# Patient Record
Sex: Female | Born: 1935
Health system: Southern US, Community
[De-identification: ages and names within clinical notes are randomized; demographics above are authoritative.]

## PROBLEM LIST (undated history)

## (undated) DIAGNOSIS — R413 Other amnesia: Secondary | ICD-10-CM

## (undated) DIAGNOSIS — D126 Benign neoplasm of colon, unspecified: Secondary | ICD-10-CM

## (undated) DIAGNOSIS — N3289 Other specified disorders of bladder: Secondary | ICD-10-CM

## (undated) DIAGNOSIS — I10 Essential (primary) hypertension: Secondary | ICD-10-CM

## (undated) DIAGNOSIS — H9190 Unspecified hearing loss, unspecified ear: Secondary | ICD-10-CM

## (undated) DIAGNOSIS — S329XXA Fracture of unspecified parts of lumbosacral spine and pelvis, initial encounter for closed fracture: Secondary | ICD-10-CM

## (undated) DIAGNOSIS — J342 Deviated nasal septum: Secondary | ICD-10-CM

## (undated) DIAGNOSIS — M1990 Unspecified osteoarthritis, unspecified site: Secondary | ICD-10-CM

## (undated) DIAGNOSIS — F419 Anxiety disorder, unspecified: Secondary | ICD-10-CM

## (undated) DIAGNOSIS — D696 Thrombocytopenia, unspecified: Secondary | ICD-10-CM

## (undated) DIAGNOSIS — D72819 Decreased white blood cell count, unspecified: Secondary | ICD-10-CM

## (undated) DIAGNOSIS — E039 Hypothyroidism, unspecified: Secondary | ICD-10-CM

## (undated) DIAGNOSIS — K7689 Other specified diseases of liver: Secondary | ICD-10-CM

## (undated) DIAGNOSIS — K760 Fatty (change of) liver, not elsewhere classified: Secondary | ICD-10-CM

## (undated) DIAGNOSIS — E785 Hyperlipidemia, unspecified: Secondary | ICD-10-CM

## (undated) HISTORY — DX: Decreased white blood cell count, unspecified: D72.819

## (undated) HISTORY — DX: Essential (primary) hypertension: I10

## (undated) HISTORY — DX: Fracture of unspecified parts of lumbosacral spine and pelvis, initial encounter for closed fracture: S32.9XXA

## (undated) HISTORY — DX: Thrombocytopenia, unspecified: D69.6

## (undated) HISTORY — DX: Anxiety disorder, unspecified: F41.9

## (undated) HISTORY — PX: NASAL SEPTUM SURGERY: SHX37

## (undated) HISTORY — DX: Other amnesia: R41.3

## (undated) HISTORY — DX: Benign neoplasm of colon, unspecified: D12.6

## (undated) HISTORY — DX: Hypothyroidism, unspecified: E03.9

## (undated) HISTORY — DX: Deviated nasal septum: J34.2

## (undated) HISTORY — PX: BLADDER REPAIR: SHX76

## (undated) HISTORY — DX: Unspecified hearing loss, unspecified ear: H91.90

## (undated) HISTORY — DX: Other specified disorders of bladder: N32.89

## (undated) HISTORY — DX: Other specified diseases of liver: K76.89

## (undated) HISTORY — DX: Unspecified osteoarthritis, unspecified site: M19.90

## (undated) HISTORY — PX: VAGINAL HYSTERECTOMY: SUR661

## (undated) HISTORY — DX: Hyperlipidemia, unspecified: E78.5

## (undated) HISTORY — DX: Fatty (change of) liver, not elsewhere classified: K76.0

---

## 1997-12-12 ENCOUNTER — Ambulatory Visit (HOSPITAL_COMMUNITY): Admission: RE | Admit: 1997-12-12 | Discharge: 1997-12-12 | Payer: Self-pay | Admitting: *Deleted

## 1998-06-15 ENCOUNTER — Ambulatory Visit (HOSPITAL_COMMUNITY): Admission: RE | Admit: 1998-06-15 | Discharge: 1998-06-15 | Payer: Self-pay | Admitting: *Deleted

## 1999-05-28 ENCOUNTER — Other Ambulatory Visit: Admission: RE | Admit: 1999-05-28 | Discharge: 1999-05-28 | Payer: Self-pay | Admitting: *Deleted

## 1999-06-18 ENCOUNTER — Ambulatory Visit (HOSPITAL_COMMUNITY): Admission: RE | Admit: 1999-06-18 | Discharge: 1999-06-18 | Payer: Self-pay | Admitting: *Deleted

## 2000-04-17 ENCOUNTER — Emergency Department (HOSPITAL_COMMUNITY): Admission: EM | Admit: 2000-04-17 | Discharge: 2000-04-18 | Payer: Self-pay | Admitting: Emergency Medicine

## 2000-04-18 ENCOUNTER — Observation Stay (HOSPITAL_COMMUNITY): Admission: EM | Admit: 2000-04-18 | Discharge: 2000-04-19 | Payer: Self-pay | Admitting: Family Medicine

## 2000-04-18 ENCOUNTER — Encounter: Payer: Self-pay | Admitting: Family Medicine

## 2000-06-25 ENCOUNTER — Ambulatory Visit (HOSPITAL_COMMUNITY): Admission: RE | Admit: 2000-06-25 | Discharge: 2000-06-25 | Payer: Self-pay | Admitting: Family Medicine

## 2000-06-25 ENCOUNTER — Encounter: Payer: Self-pay | Admitting: Family Medicine

## 2001-06-30 ENCOUNTER — Ambulatory Visit (HOSPITAL_COMMUNITY): Admission: RE | Admit: 2001-06-30 | Discharge: 2001-06-30 | Payer: Self-pay | Admitting: Family Medicine

## 2001-06-30 ENCOUNTER — Encounter: Payer: Self-pay | Admitting: Family Medicine

## 2001-08-11 DIAGNOSIS — D696 Thrombocytopenia, unspecified: Secondary | ICD-10-CM

## 2001-08-11 DIAGNOSIS — D72819 Decreased white blood cell count, unspecified: Secondary | ICD-10-CM

## 2001-08-11 HISTORY — DX: Decreased white blood cell count, unspecified: D72.819

## 2001-08-11 HISTORY — DX: Thrombocytopenia, unspecified: D69.6

## 2002-05-05 ENCOUNTER — Encounter: Admission: RE | Admit: 2002-05-05 | Discharge: 2002-05-05 | Payer: Self-pay | Admitting: Internal Medicine

## 2002-05-05 ENCOUNTER — Encounter: Payer: Self-pay | Admitting: Internal Medicine

## 2002-05-11 DIAGNOSIS — D126 Benign neoplasm of colon, unspecified: Secondary | ICD-10-CM

## 2002-05-11 HISTORY — DX: Benign neoplasm of colon, unspecified: D12.6

## 2002-05-18 ENCOUNTER — Encounter: Payer: Self-pay | Admitting: Gastroenterology

## 2002-06-08 ENCOUNTER — Encounter: Payer: Self-pay | Admitting: Gastroenterology

## 2002-07-01 ENCOUNTER — Ambulatory Visit (HOSPITAL_COMMUNITY): Admission: RE | Admit: 2002-07-01 | Discharge: 2002-07-01 | Payer: Self-pay | Admitting: Family Medicine

## 2002-07-01 ENCOUNTER — Encounter: Payer: Self-pay | Admitting: Family Medicine

## 2002-08-08 ENCOUNTER — Encounter: Payer: Self-pay | Admitting: Gastroenterology

## 2002-09-16 ENCOUNTER — Ambulatory Visit (HOSPITAL_COMMUNITY): Admission: RE | Admit: 2002-09-16 | Discharge: 2002-09-16 | Payer: Self-pay | Admitting: Oncology

## 2002-09-16 ENCOUNTER — Encounter (HOSPITAL_COMMUNITY): Payer: Self-pay | Admitting: Oncology

## 2003-07-04 ENCOUNTER — Other Ambulatory Visit: Admission: RE | Admit: 2003-07-04 | Discharge: 2003-07-04 | Payer: Self-pay | Admitting: Family Medicine

## 2004-08-16 ENCOUNTER — Ambulatory Visit: Payer: Self-pay | Admitting: Family Medicine

## 2004-09-06 ENCOUNTER — Ambulatory Visit: Payer: Self-pay | Admitting: Family Medicine

## 2004-10-17 ENCOUNTER — Ambulatory Visit: Payer: Self-pay | Admitting: Oncology

## 2004-12-03 ENCOUNTER — Ambulatory Visit: Payer: Self-pay | Admitting: Family Medicine

## 2005-04-10 ENCOUNTER — Ambulatory Visit: Payer: Self-pay | Admitting: Internal Medicine

## 2005-04-17 ENCOUNTER — Ambulatory Visit: Payer: Self-pay | Admitting: Oncology

## 2005-04-21 ENCOUNTER — Ambulatory Visit: Payer: Self-pay | Admitting: Internal Medicine

## 2005-07-02 ENCOUNTER — Ambulatory Visit: Payer: Self-pay | Admitting: Internal Medicine

## 2005-07-10 ENCOUNTER — Ambulatory Visit: Payer: Self-pay | Admitting: Internal Medicine

## 2005-08-01 ENCOUNTER — Ambulatory Visit: Payer: Self-pay | Admitting: Internal Medicine

## 2005-08-14 ENCOUNTER — Ambulatory Visit: Payer: Self-pay

## 2005-08-14 ENCOUNTER — Encounter: Payer: Self-pay | Admitting: Cardiology

## 2005-08-21 ENCOUNTER — Ambulatory Visit: Payer: Self-pay | Admitting: Internal Medicine

## 2005-08-22 ENCOUNTER — Ambulatory Visit: Payer: Self-pay | Admitting: Internal Medicine

## 2005-09-08 ENCOUNTER — Ambulatory Visit: Payer: Self-pay | Admitting: Internal Medicine

## 2005-09-11 ENCOUNTER — Ambulatory Visit: Payer: Self-pay | Admitting: Gastroenterology

## 2005-09-25 ENCOUNTER — Ambulatory Visit: Payer: Self-pay | Admitting: Gastroenterology

## 2005-09-30 ENCOUNTER — Ambulatory Visit: Payer: Self-pay | Admitting: Cardiology

## 2005-10-16 ENCOUNTER — Ambulatory Visit: Payer: Self-pay | Admitting: Oncology

## 2005-10-17 ENCOUNTER — Ambulatory Visit: Payer: Self-pay | Admitting: Internal Medicine

## 2005-10-29 ENCOUNTER — Ambulatory Visit: Payer: Self-pay

## 2005-11-24 ENCOUNTER — Ambulatory Visit: Payer: Self-pay | Admitting: Internal Medicine

## 2006-01-07 ENCOUNTER — Ambulatory Visit: Payer: Self-pay | Admitting: Internal Medicine

## 2006-03-18 ENCOUNTER — Ambulatory Visit: Payer: Self-pay | Admitting: Internal Medicine

## 2006-04-16 ENCOUNTER — Ambulatory Visit: Payer: Self-pay | Admitting: Oncology

## 2006-04-17 LAB — CBC WITH DIFFERENTIAL/PLATELET
BASO%: 0.2 % (ref 0.0–2.0)
Basophils Absolute: 0 10*3/uL (ref 0.0–0.1)
EOS%: 0.4 % (ref 0.0–7.0)
HCT: 37.3 % (ref 34.8–46.6)
HGB: 12.9 g/dL (ref 11.6–15.9)
MCHC: 34.7 g/dL (ref 32.0–36.0)
MONO#: 0.3 10*3/uL (ref 0.1–0.9)
NEUT%: 33.2 % — ABNORMAL LOW (ref 39.6–76.8)
RDW: 13.4 % (ref 11.3–14.5)
WBC: 2.8 10*3/uL — ABNORMAL LOW (ref 3.9–10.0)
lymph#: 1.6 10*3/uL (ref 0.9–3.3)

## 2006-04-17 LAB — COMPREHENSIVE METABOLIC PANEL
ALT: 21 U/L (ref 0–40)
AST: 26 U/L (ref 0–37)
Albumin: 4.2 g/dL (ref 3.5–5.2)
CO2: 27 mEq/L (ref 19–32)
Calcium: 9 mg/dL (ref 8.4–10.5)
Chloride: 105 mEq/L (ref 96–112)
Creatinine, Ser: 0.84 mg/dL (ref 0.40–1.20)
Potassium: 4.2 mEq/L (ref 3.5–5.3)

## 2006-04-17 LAB — LACTATE DEHYDROGENASE: LDH: 205 U/L (ref 94–250)

## 2006-06-29 ENCOUNTER — Ambulatory Visit: Payer: Self-pay | Admitting: Internal Medicine

## 2006-06-29 LAB — CONVERTED CEMR LAB
Free T4: 0.6 ng/dL — ABNORMAL LOW (ref 0.9–1.8)
TSH: 4.15 microintl units/mL (ref 0.35–5.50)

## 2006-07-13 ENCOUNTER — Ambulatory Visit: Payer: Self-pay | Admitting: Internal Medicine

## 2006-10-07 ENCOUNTER — Ambulatory Visit: Payer: Self-pay | Admitting: Internal Medicine

## 2006-10-07 LAB — CONVERTED CEMR LAB
ALT: 30 units/L (ref 0–40)
AST: 31 units/L (ref 0–37)
BUN: 10 mg/dL (ref 6–23)
Bacteria, UA: NEGATIVE
Creatinine, Ser: 0.6 mg/dL (ref 0.4–1.2)
Crystals: NEGATIVE
HDL: 58.5 mg/dL (ref >39.0)
Mucus, UA: NEGATIVE
RBC / HPF: NONE SEEN
Specific Gravity, Urine: 1.015 (ref 1.000–1.03)

## 2006-10-13 ENCOUNTER — Ambulatory Visit: Payer: Self-pay | Admitting: Internal Medicine

## 2007-06-07 ENCOUNTER — Encounter: Payer: Self-pay | Admitting: Internal Medicine

## 2007-06-07 DIAGNOSIS — I10 Essential (primary) hypertension: Secondary | ICD-10-CM | POA: Insufficient documentation

## 2007-07-01 ENCOUNTER — Ambulatory Visit: Payer: Self-pay | Admitting: Internal Medicine

## 2007-07-01 DIAGNOSIS — R55 Syncope and collapse: Secondary | ICD-10-CM

## 2007-07-01 LAB — CONVERTED CEMR LAB
Basophils Absolute: 0 10*3/uL (ref 0.0–0.1)
Eosinophils Absolute: 0 10*3/uL (ref 0.0–0.6)
HCT: 41.3 % (ref 36.0–46.0)
Hemoglobin: 14.1 g/dL (ref 12.0–15.0)
Lymphocytes Relative: 49.7 % — ABNORMAL HIGH (ref 12.0–46.0)
MCHC: 34.1 g/dL (ref 30.0–36.0)
MCV: 87 fL (ref 78.0–100.0)
Monocytes Absolute: 0.4 10*3/uL (ref 0.2–0.7)
Neutrophils Relative %: 36.9 % — ABNORMAL LOW (ref 43.0–77.0)
RDW: 12.7 % (ref 11.5–14.6)

## 2007-07-07 ENCOUNTER — Ambulatory Visit: Payer: Self-pay | Admitting: Internal Medicine

## 2007-07-07 DIAGNOSIS — R799 Abnormal finding of blood chemistry, unspecified: Secondary | ICD-10-CM

## 2007-07-07 DIAGNOSIS — E039 Hypothyroidism, unspecified: Secondary | ICD-10-CM | POA: Insufficient documentation

## 2007-07-07 DIAGNOSIS — F411 Generalized anxiety disorder: Secondary | ICD-10-CM

## 2007-09-29 ENCOUNTER — Encounter: Payer: Self-pay | Admitting: Internal Medicine

## 2007-11-10 ENCOUNTER — Telehealth: Payer: Self-pay | Admitting: Internal Medicine

## 2007-11-30 ENCOUNTER — Telehealth: Payer: Self-pay | Admitting: Internal Medicine

## 2007-12-23 ENCOUNTER — Ambulatory Visit: Payer: Self-pay | Admitting: Internal Medicine

## 2007-12-24 LAB — CONVERTED CEMR LAB
AST: 29 units/L (ref 0–37)
Albumin: 3.9 g/dL (ref 3.5–5.2)
Alkaline Phosphatase: 90 units/L (ref 39–117)
BUN: 12 mg/dL (ref 6–23)
CO2: 31 meq/L (ref 19–32)
Chloride: 104 meq/L (ref 96–112)
Eosinophils Relative: 3.6 % (ref 0.0–5.0)
Glucose, Bld: 97 mg/dL (ref 70–99)
HCT: 41.6 % (ref 36.0–46.0)
HDL: 45.1 mg/dL (ref >39.0)
Hemoglobin, Urine: NEGATIVE
Lymphocytes Relative: 54.7 % — ABNORMAL HIGH (ref 12.0–46.0)
Monocytes Relative: 12.4 % — ABNORMAL HIGH (ref 3.0–12.0)
Neutrophils Relative %: 28.3 % — ABNORMAL LOW (ref 43.0–77.0)
Nitrite: NEGATIVE
Platelets: 141 10*3/uL — ABNORMAL LOW (ref 150–400)
Potassium: 4.4 meq/L (ref 3.5–5.1)
Total Protein, Urine: NEGATIVE mg/dL
Total Protein: 7.4 g/dL (ref 6.0–8.3)
WBC: 3.3 10*3/uL — ABNORMAL LOW (ref 4.5–10.5)

## 2007-12-29 ENCOUNTER — Ambulatory Visit: Payer: Self-pay | Admitting: Internal Medicine

## 2007-12-29 DIAGNOSIS — R350 Frequency of micturition: Secondary | ICD-10-CM

## 2007-12-29 DIAGNOSIS — I739 Peripheral vascular disease, unspecified: Secondary | ICD-10-CM | POA: Insufficient documentation

## 2007-12-29 DIAGNOSIS — E785 Hyperlipidemia, unspecified: Secondary | ICD-10-CM

## 2007-12-31 ENCOUNTER — Ambulatory Visit: Payer: Self-pay

## 2008-04-05 ENCOUNTER — Ambulatory Visit: Payer: Self-pay | Admitting: Internal Medicine

## 2008-04-05 DIAGNOSIS — R61 Generalized hyperhidrosis: Secondary | ICD-10-CM | POA: Insufficient documentation

## 2008-04-11 ENCOUNTER — Ambulatory Visit: Payer: Self-pay | Admitting: Internal Medicine

## 2008-04-11 DIAGNOSIS — R079 Chest pain, unspecified: Secondary | ICD-10-CM

## 2008-04-13 ENCOUNTER — Ambulatory Visit: Payer: Self-pay | Admitting: Internal Medicine

## 2008-04-24 ENCOUNTER — Telehealth: Payer: Self-pay | Admitting: Internal Medicine

## 2008-08-01 ENCOUNTER — Ambulatory Visit: Payer: Self-pay | Admitting: Internal Medicine

## 2008-08-01 DIAGNOSIS — R519 Headache, unspecified: Secondary | ICD-10-CM | POA: Insufficient documentation

## 2008-08-01 DIAGNOSIS — R51 Headache: Secondary | ICD-10-CM | POA: Insufficient documentation

## 2008-09-29 ENCOUNTER — Encounter: Payer: Self-pay | Admitting: Internal Medicine

## 2008-10-31 ENCOUNTER — Ambulatory Visit: Payer: Self-pay | Admitting: Internal Medicine

## 2008-12-25 ENCOUNTER — Ambulatory Visit: Payer: Self-pay | Admitting: Internal Medicine

## 2008-12-25 LAB — CONVERTED CEMR LAB
ALT: 50 units/L — ABNORMAL HIGH (ref 0–35)
Albumin: 3.5 g/dL (ref 3.5–5.2)
Alkaline Phosphatase: 62 units/L (ref 39–117)
BUN: 11 mg/dL (ref 6–23)
Basophils Relative: 0.8 % (ref 0.0–3.0)
Bilirubin Urine: NEGATIVE
Bilirubin, Direct: 0.1 mg/dL (ref 0.0–0.3)
CO2: 29 meq/L (ref 19–32)
Calcium: 8.6 mg/dL (ref 8.4–10.5)
Direct LDL: 173.8 mg/dL
Eosinophils Absolute: 0.1 10*3/uL (ref 0.0–0.7)
Eosinophils Relative: 1.8 % (ref 0.0–5.0)
GFR calc non Af Amer: 86.89 mL/min (ref >60)
Glucose, Bld: 88 mg/dL (ref 70–99)
Hemoglobin, Urine: NEGATIVE
Hemoglobin: 12.9 g/dL (ref 12.0–15.0)
Ketones, ur: NEGATIVE mg/dL
Lymphocytes Relative: 50.4 % — ABNORMAL HIGH (ref 12.0–46.0)
MCHC: 34 g/dL (ref 30.0–36.0)
MCV: 86 fL (ref 78.0–100.0)
Neutro Abs: 1 10*3/uL — ABNORMAL LOW (ref 1.4–7.7)
RBC: 4.43 M/uL (ref 3.87–5.11)
Total Protein, Urine: NEGATIVE mg/dL
Total Protein: 6.8 g/dL (ref 6.0–8.3)
Urine Glucose: NEGATIVE mg/dL
Urobilinogen, UA: 0.2 (ref 0.0–1.0)
VLDL: 15 mg/dL (ref 0.0–40.0)
WBC: 3.2 10*3/uL — ABNORMAL LOW (ref 4.5–10.5)

## 2008-12-28 ENCOUNTER — Ambulatory Visit: Payer: Self-pay | Admitting: Internal Medicine

## 2008-12-28 DIAGNOSIS — R945 Abnormal results of liver function studies: Secondary | ICD-10-CM

## 2009-01-10 ENCOUNTER — Encounter: Admission: RE | Admit: 2009-01-10 | Discharge: 2009-01-10 | Payer: Self-pay | Admitting: Internal Medicine

## 2009-03-21 ENCOUNTER — Encounter: Payer: Self-pay | Admitting: Internal Medicine

## 2009-03-26 ENCOUNTER — Ambulatory Visit: Payer: Self-pay | Admitting: Internal Medicine

## 2009-03-27 LAB — CONVERTED CEMR LAB
ALT: 64 units/L — ABNORMAL HIGH (ref 0–35)
Albumin: 3.7 g/dL (ref 3.5–5.2)
BUN: 13 mg/dL (ref 6–23)
Basophils Absolute: 0 10*3/uL (ref 0.0–0.1)
Basophils Relative: 0.1 % (ref 0.0–3.0)
CO2: 33 meq/L — ABNORMAL HIGH (ref 19–32)
Calcium: 8.9 mg/dL (ref 8.4–10.5)
Chloride: 107 meq/L (ref 96–112)
Creatinine, Ser: 0.8 mg/dL (ref 0.4–1.2)
Eosinophils Absolute: 0 10*3/uL (ref 0.0–0.7)
Glucose, Bld: 85 mg/dL (ref 70–99)
Hemoglobin: 13.2 g/dL (ref 12.0–15.0)
Lymphocytes Relative: 49.4 % — ABNORMAL HIGH (ref 12.0–46.0)
MCHC: 33.5 g/dL (ref 30.0–36.0)
Monocytes Relative: 14.3 % — ABNORMAL HIGH (ref 3.0–12.0)
Neutro Abs: 1.1 10*3/uL — ABNORMAL LOW (ref 1.4–7.7)
Neutrophils Relative %: 35.6 % — ABNORMAL LOW (ref 43.0–77.0)
RBC: 4.54 M/uL (ref 3.87–5.11)
Total Protein: 7.1 g/dL (ref 6.0–8.3)

## 2009-03-29 ENCOUNTER — Ambulatory Visit: Payer: Self-pay | Admitting: Internal Medicine

## 2009-03-29 DIAGNOSIS — D485 Neoplasm of uncertain behavior of skin: Secondary | ICD-10-CM

## 2009-03-29 DIAGNOSIS — M545 Low back pain: Secondary | ICD-10-CM

## 2009-03-29 DIAGNOSIS — F329 Major depressive disorder, single episode, unspecified: Secondary | ICD-10-CM | POA: Insufficient documentation

## 2009-06-14 ENCOUNTER — Ambulatory Visit: Payer: Self-pay | Admitting: Internal Medicine

## 2009-06-14 LAB — CONVERTED CEMR LAB
ALT: 100 units/L — ABNORMAL HIGH (ref 0–35)
AST: 96 units/L — ABNORMAL HIGH (ref 0–37)
BUN: 11 mg/dL (ref 6–23)
Basophils Absolute: 0 10*3/uL (ref 0.0–0.1)
Bilirubin, Direct: 0.1 mg/dL (ref 0.0–0.3)
CO2: 31 meq/L (ref 19–32)
Chloride: 103 meq/L (ref 96–112)
Creatinine, Ser: 0.8 mg/dL (ref 0.4–1.2)
Eosinophils Absolute: 0 10*3/uL (ref 0.0–0.7)
Ferritin: 197.9 ng/mL (ref 10.0–291.0)
HCT: 39.1 % (ref 36.0–46.0)
Lymphs Abs: 1.4 10*3/uL (ref 0.7–4.0)
MCV: 87.7 fL (ref 78.0–100.0)
Monocytes Absolute: 0.4 10*3/uL (ref 0.1–1.0)
Neutrophils Relative %: 37 % — ABNORMAL LOW (ref 43.0–77.0)
Platelets: 92 10*3/uL — ABNORMAL LOW (ref 150.0–400.0)
Potassium: 3.9 meq/L (ref 3.5–5.1)
RDW: 13.6 % (ref 11.5–14.6)
Sed Rate: 16 mm/hr (ref 0–22)
Total Bilirubin: 0.8 mg/dL (ref 0.3–1.2)
Total Protein: 7.7 g/dL (ref 6.0–8.3)

## 2009-06-20 ENCOUNTER — Ambulatory Visit: Payer: Self-pay | Admitting: Internal Medicine

## 2009-06-20 DIAGNOSIS — N3289 Other specified disorders of bladder: Secondary | ICD-10-CM

## 2009-07-03 ENCOUNTER — Telehealth: Payer: Self-pay | Admitting: Internal Medicine

## 2009-09-21 ENCOUNTER — Ambulatory Visit: Payer: Self-pay | Admitting: Internal Medicine

## 2009-09-21 LAB — CONVERTED CEMR LAB
ALT: 62 units/L — ABNORMAL HIGH (ref 0–35)
AST: 69 units/L — ABNORMAL HIGH (ref 0–37)
Albumin: 3.8 g/dL (ref 3.5–5.2)
Alkaline Phosphatase: 78 units/L (ref 39–117)
Anti Nuclear Antibody(ANA): NEGATIVE
Basophils Absolute: 0 10*3/uL (ref 0.0–0.1)
CO2: 30 meq/L (ref 19–32)
Chloride: 105 meq/L (ref 96–112)
GFR calc non Af Amer: 86.72 mL/min (ref >60)
Glucose, Bld: 85 mg/dL (ref 70–99)
HCT: 39.1 % (ref 36.0–46.0)
Lymphs Abs: 1.4 10*3/uL (ref 0.7–4.0)
Monocytes Absolute: 0.5 10*3/uL (ref 0.1–1.0)
Monocytes Relative: 14.1 % — ABNORMAL HIGH (ref 3.0–12.0)
Neutrophils Relative %: 42 % — ABNORMAL LOW (ref 43.0–77.0)
Platelets: 81 10*3/uL — ABNORMAL LOW (ref 150.0–400.0)
Potassium: 3.3 meq/L — ABNORMAL LOW (ref 3.5–5.1)
RDW: 12.5 % (ref 11.5–14.6)
Sodium: 141 meq/L (ref 135–145)
Total Protein: 7.6 g/dL (ref 6.0–8.3)

## 2009-09-26 ENCOUNTER — Ambulatory Visit: Payer: Self-pay | Admitting: Internal Medicine

## 2009-09-26 DIAGNOSIS — E876 Hypokalemia: Secondary | ICD-10-CM | POA: Insufficient documentation

## 2009-09-27 ENCOUNTER — Encounter (INDEPENDENT_AMBULATORY_CARE_PROVIDER_SITE_OTHER): Payer: Self-pay | Admitting: *Deleted

## 2009-10-03 ENCOUNTER — Encounter: Payer: Self-pay | Admitting: Internal Medicine

## 2009-10-25 ENCOUNTER — Encounter (INDEPENDENT_AMBULATORY_CARE_PROVIDER_SITE_OTHER): Payer: Self-pay | Admitting: *Deleted

## 2009-12-10 ENCOUNTER — Ambulatory Visit: Payer: Self-pay | Admitting: Gastroenterology

## 2009-12-10 DIAGNOSIS — Z860100 Personal history of colon polyps, unspecified: Secondary | ICD-10-CM | POA: Insufficient documentation

## 2009-12-10 DIAGNOSIS — R933 Abnormal findings on diagnostic imaging of other parts of digestive tract: Secondary | ICD-10-CM

## 2009-12-10 DIAGNOSIS — R7401 Elevation of levels of liver transaminase levels: Secondary | ICD-10-CM | POA: Insufficient documentation

## 2009-12-10 DIAGNOSIS — R740 Nonspecific elevation of levels of transaminase and lactic acid dehydrogenase [LDH]: Secondary | ICD-10-CM

## 2009-12-10 LAB — CONVERTED CEMR LAB
Angiotensin 1 Converting Enzyme: 55 units/L (ref 9–67)
Ceruloplasmin: 41 mg/dL (ref 21–63)
Saturation Ratios: 18.4 % — ABNORMAL LOW (ref 20.0–50.0)

## 2009-12-12 ENCOUNTER — Encounter: Admission: RE | Admit: 2009-12-12 | Discharge: 2009-12-12 | Payer: Self-pay | Admitting: Gastroenterology

## 2009-12-12 ENCOUNTER — Encounter: Payer: Self-pay | Admitting: Gastroenterology

## 2009-12-13 IMAGING — US US ABDOMEN COMPLETE
1 series · 14 of 25 positions shown · non-contrast
Comparison: None

CLINICAL DATA: Elevated liver function tests.

COMPLETE ABDOMINAL ULTRASOUND

[Series 1: us abdomen complete · 0.28mm/px · 14 of 73 slices shown]
[im 1/73]
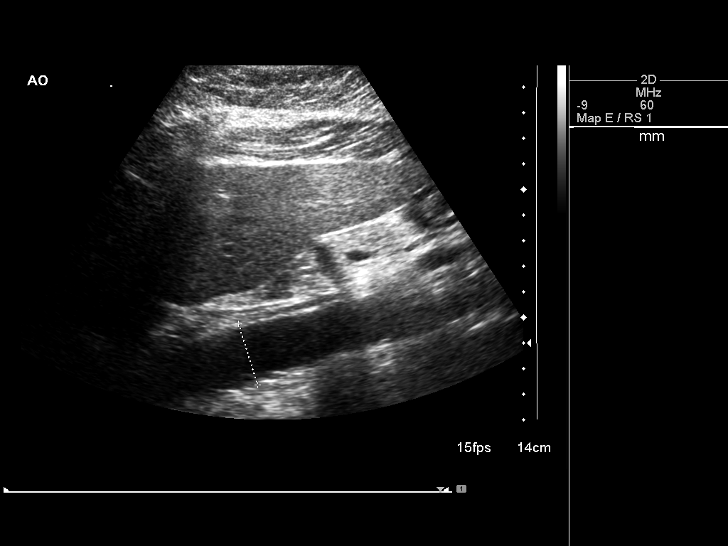
[im 7/73]
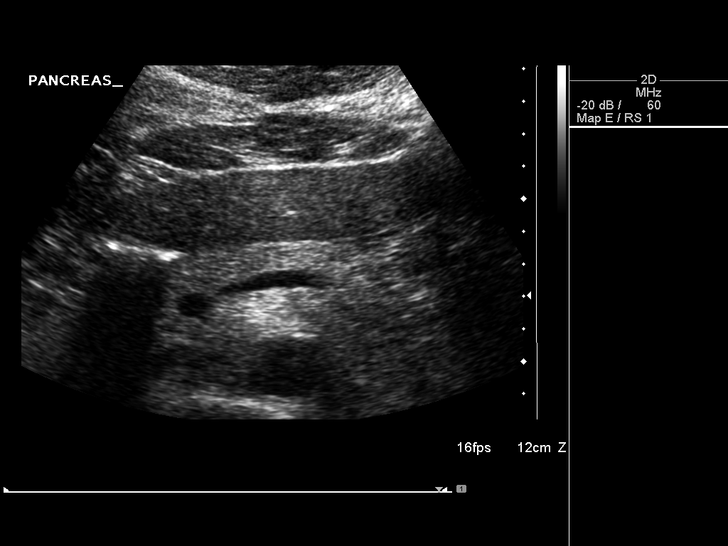
[im 13/73]
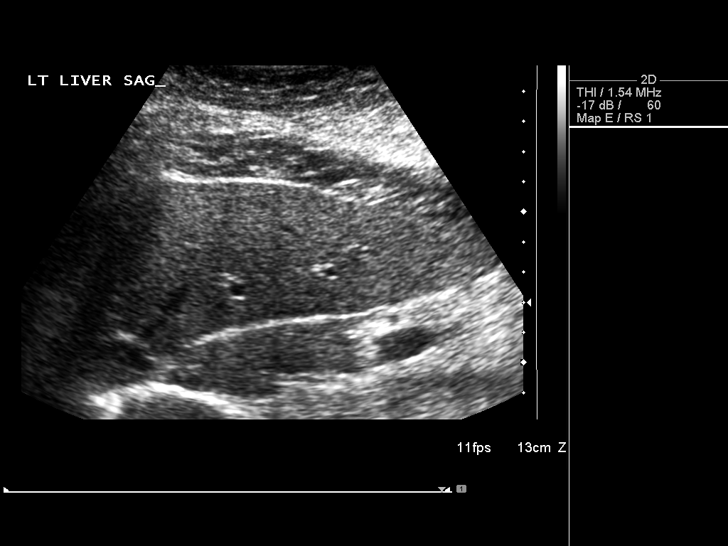
[im 19/73]
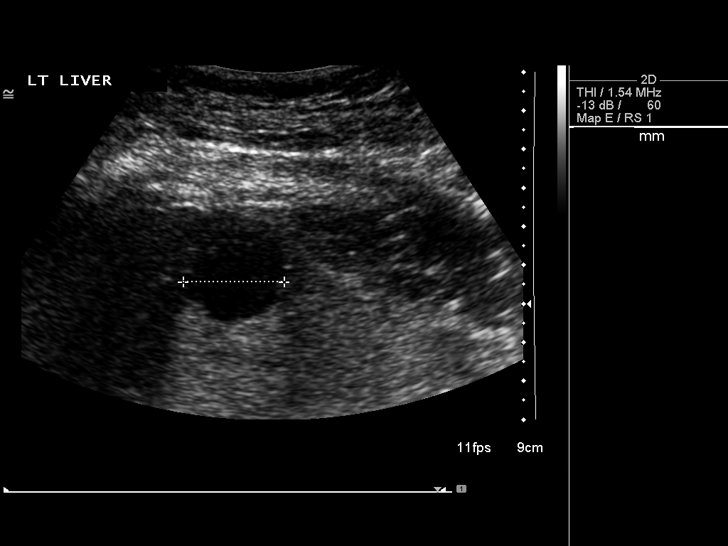
[im 25/73]
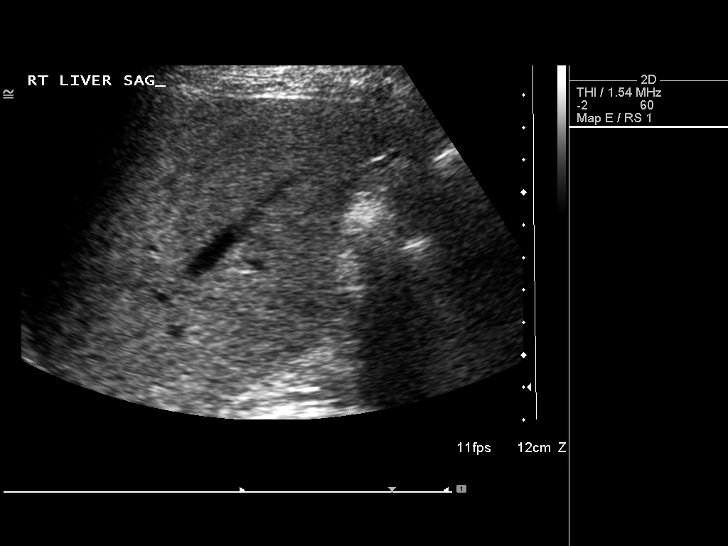
[im 28/73]
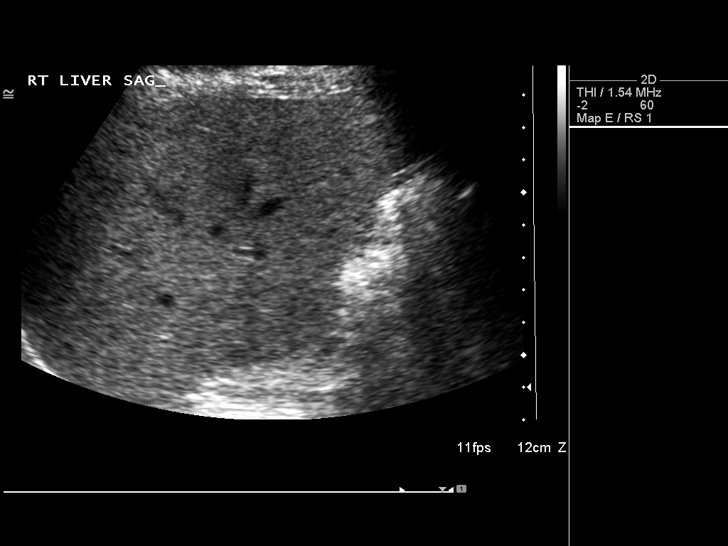
[im 34/73]
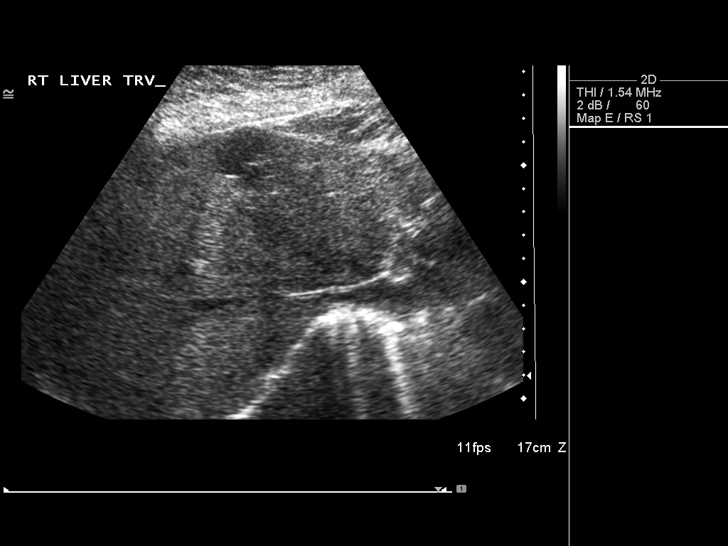
[im 40/73]
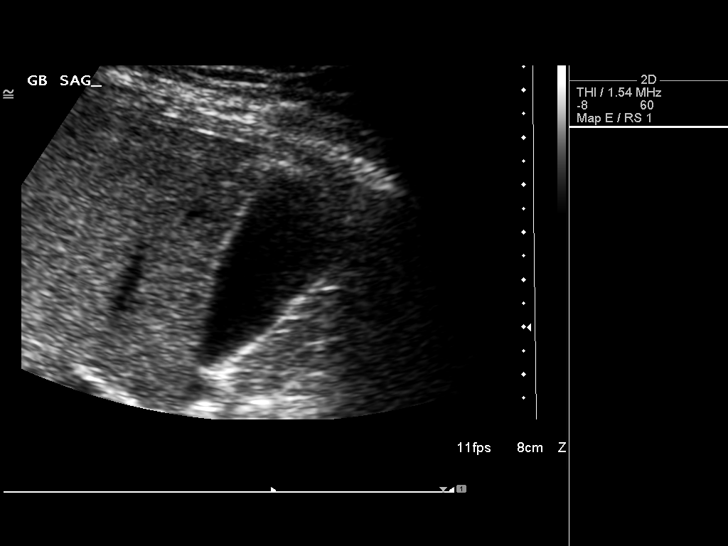
[im 46/73]
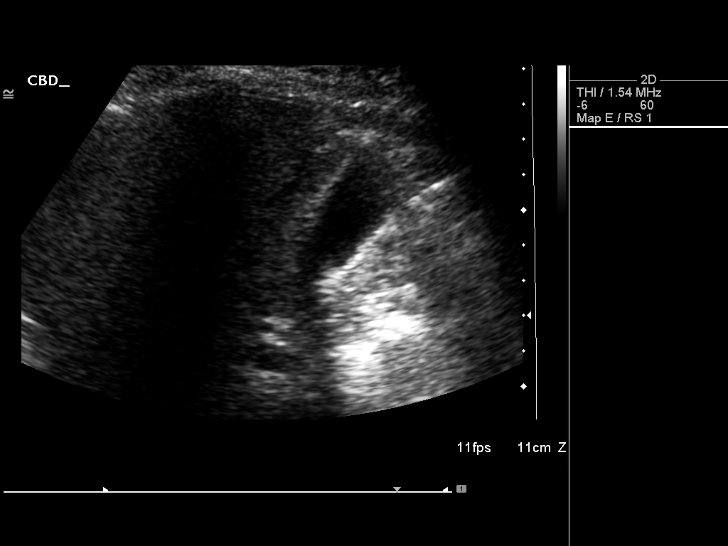
[im 49/73]
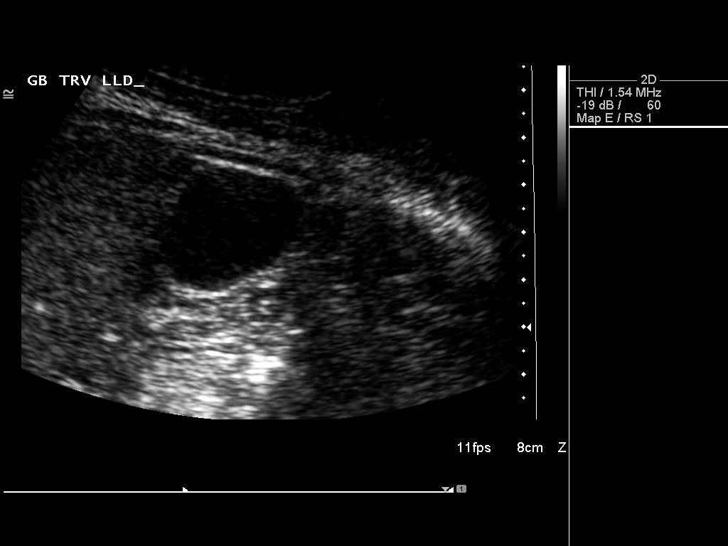
[im 55/73]
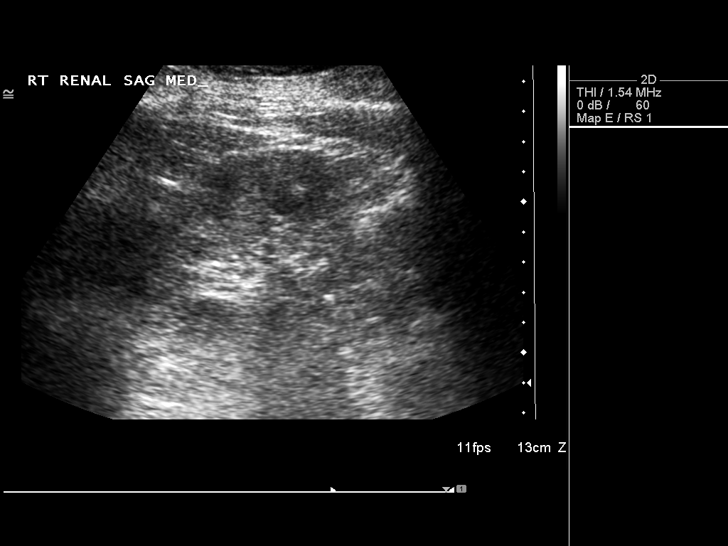
[im 61/73]
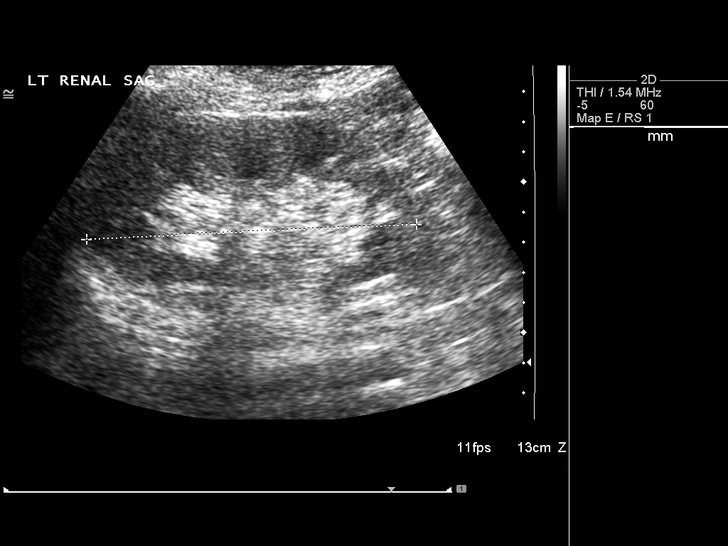
[im 67/73]
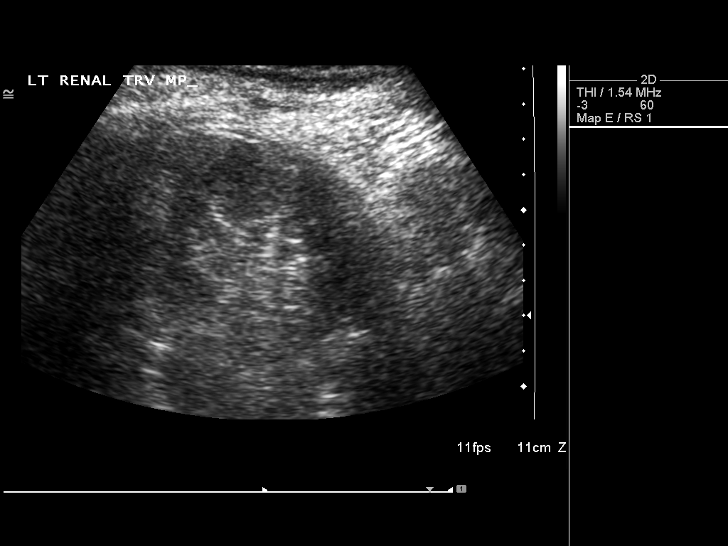
[im 73/73]
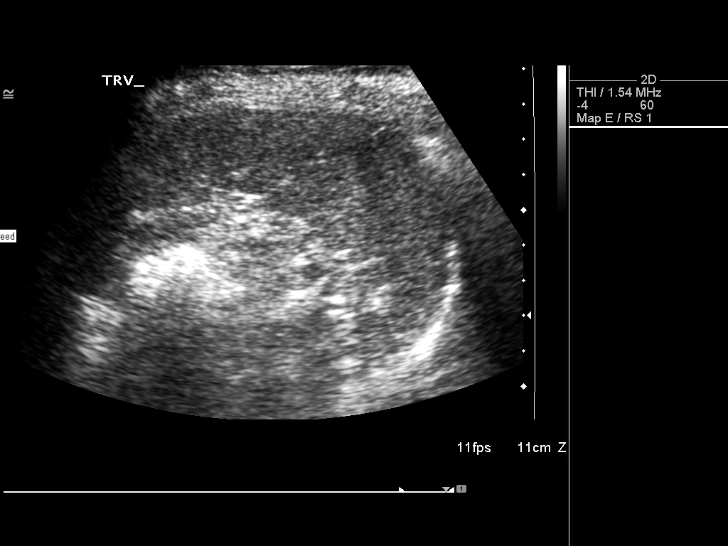

[14 of 25 positions shown; findings below may reference images not displayed]

FINDINGS: Gallbladder:  Negative.

Common bile duct:  3 mm, within normal limits.

Liver:  Contains a 3.6 x 2.7 x 2.6 cm cyst, which may be minimally
complex.  Otherwise negative.

IVC:  Visualized.

Pancreas:  Negative.

Spleen:  5.7 cm, negative.

Right Kidney:  10.2 cm, negative.

Left Kidney:  11.0 cm, negative.

Abdominal aorta:  Measures up to 2.5 cm.
IMPRESSION: No acute findings.

## 2010-01-23 ENCOUNTER — Ambulatory Visit: Payer: Self-pay | Admitting: Internal Medicine

## 2010-01-24 LAB — CONVERTED CEMR LAB
ALT: 41 units/L — ABNORMAL HIGH (ref 0–35)
AST: 52 units/L — ABNORMAL HIGH (ref 0–37)
Basophils Absolute: 0 10*3/uL (ref 0.0–0.1)
Bilirubin, Direct: 0.1 mg/dL (ref 0.0–0.3)
Chloride: 106 meq/L (ref 96–112)
HCT: 37.5 % (ref 36.0–46.0)
Lymphs Abs: 1.4 10*3/uL (ref 0.7–4.0)
MCV: 84.1 fL (ref 78.0–100.0)
Monocytes Absolute: 0.4 10*3/uL (ref 0.1–1.0)
Platelets: 95 10*3/uL — ABNORMAL LOW (ref 150.0–400.0)
Potassium: 3.9 meq/L (ref 3.5–5.1)
RDW: 14.3 % (ref 11.5–14.6)
Total Bilirubin: 0.5 mg/dL (ref 0.3–1.2)

## 2010-01-30 ENCOUNTER — Ambulatory Visit: Payer: Self-pay | Admitting: Gastroenterology

## 2010-01-30 DIAGNOSIS — K7689 Other specified diseases of liver: Secondary | ICD-10-CM

## 2010-05-28 ENCOUNTER — Ambulatory Visit: Payer: Self-pay | Admitting: Internal Medicine

## 2010-05-29 LAB — CONVERTED CEMR LAB
Albumin: 3.7 g/dL (ref 3.5–5.2)
Alkaline Phosphatase: 70 units/L (ref 39–117)
Basophils Relative: 0.5 % (ref 0.0–3.0)
Bilirubin, Direct: 0.1 mg/dL (ref 0.0–0.3)
Calcium: 9.2 mg/dL (ref 8.4–10.5)
Eosinophils Absolute: 0 10*3/uL (ref 0.0–0.7)
Eosinophils Relative: 1 % (ref 0.0–5.0)
GFR calc non Af Amer: 71.11 mL/min (ref >60)
Glucose, Bld: 86 mg/dL (ref 70–99)
Hemoglobin: 12.7 g/dL (ref 12.0–15.0)
Lymphocytes Relative: 47.8 % — ABNORMAL HIGH (ref 12.0–46.0)
MCHC: 33.4 g/dL (ref 30.0–36.0)
Monocytes Relative: 13.4 % — ABNORMAL HIGH (ref 3.0–12.0)
Neutro Abs: 1.3 10*3/uL — ABNORMAL LOW (ref 1.4–7.7)
RBC: 4.48 M/uL (ref 3.87–5.11)
Sodium: 144 meq/L (ref 135–145)
WBC: 3.6 10*3/uL — ABNORMAL LOW (ref 4.5–10.5)

## 2010-06-03 ENCOUNTER — Ambulatory Visit: Payer: Self-pay | Admitting: Internal Medicine

## 2010-09-04 ENCOUNTER — Encounter (INDEPENDENT_AMBULATORY_CARE_PROVIDER_SITE_OTHER): Payer: Self-pay | Admitting: *Deleted

## 2010-09-09 ENCOUNTER — Encounter (INDEPENDENT_AMBULATORY_CARE_PROVIDER_SITE_OTHER): Payer: Self-pay | Admitting: *Deleted

## 2010-09-10 NOTE — Letter (Signed)
Summary: Appointment - Reschedule  [facility]  [address] [place], [place] [address]   Phone: [phone]  Fax: [phone]     October 25, 2009 MRN: [id number]   [name] [name] [address] [place], [place]  [address]   Dear Ms. Corkern,   Due to a change in our office schedule, your appointment on March 22, at 1:45pm                must be changed.  It is very important that we reach you to reschedule this appointment. We look forward to participating in your health care needs. Please contact us at the number listed above at your earliest convenience to reschedule this appointment.     Sincerely,  Social research officer, government

## 2010-09-10 NOTE — Assessment & Plan Note (Signed)
Summary: 4 month follow up-lb   Vital Signs:  Patient profile:   77 year old female Height:      66 inches Weight:      165 pounds BMI:     26.73 Temp:     97.7 degrees F oral Pulse rate:   80 / minute Pulse rhythm:   regular Resp:     16 per minute BP sitting:   130 / 80  (left arm) Cuff size:   regular  Vitals Entered By: Lanier Prude, CMA(AAMA) (June 03, 2010 8:08 AM) CC: 4 mo f/u Is Patient Diabetic? No   Primary Care Dietrick Barris:  Jacinta Shoe, MD  CC:  4 mo f/u.  History of Present Illness: F/u fatty liver, abnormal CBC, anxiety  Preventive Screening-Counseling & Management  Alcohol-Tobacco     Smoking Status: never  Current Medications (verified): 1)  Ativan 1 Mg Tabs (Lorazepam) .Marland Kitchen.. 1 Two Times A Day As Needed 2)  Norvasc 5 Mg Tabs (Amlodipine Besylate) .Marland Kitchen.. 1 Once Daily 3)  Synthroid 25 Mcg Tabs (Levothyroxine Sodium) .Marland Kitchen.. 1 Once Daily 4)  Baby Aspirin 81 Mg  Chew (Aspirin) .... Once Daily 5)  Vitamin D3 1000 Unit  Tabs (Cholecalciferol) .Marland Kitchen.. 1 By Mouth Daily 6)  Triamcinolone Acetonide 0.5 % Crea (Triamcinolone Acetonide) .... Apply Bid To Affected Area As Needed  Allergies (verified): 1)  ! Codeine Sulfate (Codeine Sulfate) 2)  ! Celexa (Citalopram Hydrobromide) 3)  Fioricet (Butalbital-Apap-Caffeine) 4)  Nortriptyline Hcl  Past History:  Past Medical History: Hypertension Anxiety Hypothyroidism Low WBC, PLT since 2003 Dr Arline Asp Hyperlipidemia Irritable bladder Low back pain, LLE pain Depression Elev LFTs 2010 abd [place] nl Deviated septum Adenomatous Colon Polyps 05/2002 Hepatic steatosis Hepatic cyst, 4cm Skin Ca - chest 2011  Physical Exam  General:  Well-developed,well-nourished,in no acute distress; alert,appropriate and cooperative throughout examination Nose:  AK x2 on nose Mouth:  WNL Lungs:  Normal respiratory effort, chest expands symmetrically. Lungs are clear to auscultation, no crackles or wheezes. Heart:  Normal  rate and regular rhythm. S1 and S2 normal without gallop, murmur, click, rub or other extra sounds. Abdomen:  Bowel sounds positive,abdomen soft and non-tender without masses, organomegaly or hernias noted. Msk:  No deformity or scoliosis noted of thoracic or lumbar spine.   Neurologic:  No cranial nerve deficits noted. Station and gait are normal. Plantar reflexes are down-going bilaterally. DTRs are symmetrical throughout. Sensory, motor and coordinative functions appear intact. Skin:  aging changes  Psych:  Cognition and judgment appear intact. Alert and cooperative with normal attention span and concentration. No apparent delusions, illusions, hallucinations   Impression & Recommendations:  Problem # 1:  FATTY LIVER DISEASE (ICD-571.8) Assessment Unchanged The labs were reviewed with the patient.  Wt loss/diet  Problem # 2:  ABNORMAL LIVER FUNCTION TESTS (ICD-794.8) Assessment: Unchanged as above  Problem # 3:  CBC, ABNORMAL (ICD-790.99) Assessment: Unchanged The labs were reviewed with the patient.   Problem # 4:  DEPRESSION (ICD-311)/ anxiety Assessment: Improved  Complete Medication List: 1)  Ativan 1 Mg Tabs (Lorazepam) .Marland Kitchen.. 1 two times a day as needed 2)  Norvasc 5 Mg Tabs (Amlodipine besylate) .Marland Kitchen.. 1 once daily 3)  Synthroid 25 Mcg Tabs (Levothyroxine sodium) .Marland Kitchen.. 1 once daily 4)  Baby Aspirin 81 Mg Chew (Aspirin) .... Once daily 5)  Vitamin D3 1000 Unit Tabs (Cholecalciferol) .Marland Kitchen.. 1 by mouth daily 6)  Triamcinolone Acetonide 0.5 % Crea (Triamcinolone acetonide) .... Apply bid to affected area as needed  Patient  Instructions: 1)  Please schedule a follow-up appointment in 6 months well exam.   Orders Added: 1)  Est. Patient Level IV [[address]]   Not Administered:    Influenza Vaccine not given due to: declined   Contraindications/Deferment of Procedures/Staging:    Test/Procedure: FLU VAX    Reason for deferment: patient declined     Test/Procedure: Pneumovax  vaccine    Reason for deferment: patient declined

## 2010-09-10 NOTE — Assessment & Plan Note (Signed)
Summary: 3 mos f/u #/cd   Vital Signs:  Patient profile:   77 year old female Height:      66 inches Weight:      171 pounds BMI:     27.70 Temp:     97.7 degrees F oral Pulse rate:   76 / minute BP sitting:   144 / 90  (left arm)  Vitals Entered By: Tora Perches (September 26, 2009 9:20 AM) CC: f/u Is Patient Diabetic? No   CC:  f/u.  History of Present Illness: The patient presents for a follow up of hypertension, elev LFT, hyperlipidemia   Preventive Screening-Counseling & Management  Alcohol-Tobacco     Smoking Status: never  Allergies: 1)  ! Codeine Sulfate (Codeine Sulfate) 2)  ! Celexa (Citalopram Hydrobromide) 3)  Fioricet (Butalbital-Apap-Caffeine) 4)  Nortriptyline Hcl  Family History: Family History Hypertension S has elev LFTs  Review of Systems  The patient denies fever, weight gain, dyspnea on exertion, prolonged cough, and abdominal pain.    Physical Exam  General:  Well-developed,well-nourished,in no acute distress; alert,appropriate and cooperative throughout examination Nose:  AK x2 on nose Mouth:  WNL Neck:  Cervical spine is tender to palpation over paraspinal muscles and with the ROM  Lungs:  Normal respiratory effort, chest expands symmetrically. Lungs are clear to auscultation, no crackles or wheezes. Heart:  Normal rate and regular rhythm. S1 and S2 normal without gallop, murmur, click, rub or other extra sounds. Abdomen:  Bowel sounds positive,abdomen soft and non-tender without masses, organomegaly or hernias noted. Msk:  No deformity or scoliosis noted of thoracic or lumbar spine.   Neurologic:  No cranial nerve deficits noted. Station and gait are normal. Plantar reflexes are down-going bilaterally. DTRs are symmetrical throughout. Sensory, motor and coordinative functions appear intact.   Impression & Recommendations:  Problem # 1:  ABNORMAL LIVER FUNCTION TESTS (ICD-794.8) unclear etiol Assessment Deteriorated  The labs were  reviewed with the patient.   Orders: Gastroenterology Referral (GI)  Problem # 2:  CBC, ABNORMAL (ICD-790.99) Assessment: Unchanged The labs were reviewed with the patient.   Problem # 3:  DEPRESSION (ICD-311) Assessment: Comment Only  The following medications were removed from the medication list:    Trazodone Hcl 50 Mg Tabs (Trazodone hcl) .Marland Kitchen... 1 by mouth at bedtime Her updated medication list for this problem includes:    Ativan 1 Mg Tabs (Lorazepam) .Marland Kitchen... 1 two times a day as needed  Problem # 4:  LOW BACK PAIN (ICD-724.2) Assessment: Unchanged  Her updated medication list for this problem includes:    Baby Aspirin 81 Mg Chew (Aspirin) ..... Once daily  Problem # 5:  HYPOTHYROIDISM (ICD-244.9) Assessment: Comment Only  Her updated medication list for this problem includes:    Synthroid 25 Mcg Tabs (Levothyroxine sodium) .Marland Kitchen... 1 once daily  Labs Reviewed: TSH: 3.43 (06/14/2009)    Chol: 228 (12/25/2008)   HDL: 48.60 (12/25/2008)   LDL: DEL (12/23/2007)   TG: 75.0 (12/25/2008)  Problem # 6:  HYPOKALEMIA (ICD-276.8) mild Assessment: New The labs were reviewed with the patient.  OTC potassium 1 a day  Complete Medication List: 1)  Ativan 1 Mg Tabs (Lorazepam) .Marland Kitchen.. 1 two times a day as needed 2)  Norvasc 5 Mg Tabs (Amlodipine besylate) .Marland Kitchen.. 1 once daily 3)  Synthroid 25 Mcg Tabs (Levothyroxine sodium) .Marland Kitchen.. 1 once daily 4)  Baby Aspirin 81 Mg Chew (Aspirin) .... Once daily 5)  Vitamin D3 1000 Unit Tabs (Cholecalciferol) .Marland Kitchen.. 1 by mouth daily  6)  Triamcinolone Acetonide 0.5 % Crea (Triamcinolone acetonide) .... Apply bid to affected area  Patient Instructions: 1)  Please schedule a follow-up appointment in 4 months. 2)  BMP prior to visit, ICD-9: 3)  Hepatic Panel prior to visit, ICD-9: 4)  TSH prior to visit, ICD-9: 995.20 244.8 5)  CBC w/ Diff prior to visit, ICD-9: Prescriptions: SYNTHROID 25 MCG TABS (LEVOTHYROXINE SODIUM) 1 once daily  #90 x 3   Entered and  Authorized by:   Tresa Garter MD   Signed by:   Tresa Garter MD on 09/26/2009   Method used:   Print then Give to Patient   RxID:   [id number] NORVASC 5 MG TABS (AMLODIPINE BESYLATE) 1 once daily  #90 x 3   Entered and Authorized by:   Tresa Garter MD   Signed by:   Tresa Garter MD on 09/26/2009   Method used:   Print then Give to Patient   RxID:   [id number] ATIVAN 1 MG TABS (LORAZEPAM) 1 two times a day as needed  #180 x 1   Entered and Authorized by:   Tresa Garter MD   Signed by:   Tresa Garter MD on 09/26/2009   Method used:   Print then Give to Patient   RxID:   [id number]

## 2010-09-10 NOTE — Letter (Signed)
Summary: New Patient letter  [facility]  [address] [place], [place] [address]   Phone: [phone]  Fax: [phone]       09/27/2009 MRN: [id number]  [name] [name] [address] [place], [place]  [address]  Dear [name] [name],  Welcome to the Gastroenterology Division at [facility]    You are scheduled to see Dr.  Russella Dar on 10-30-09 at 1:45p.m. on the 3rd floor at [facility] [address]  We ask that you try to arrive at our office 15 minutes prior to your appointment time to allow for check-in.  We would like you to complete the enclosed self-administered evaluation form prior to your visit and bring it with you on the day of your appointment.  We will review it with you.  Also, please bring a complete list of all your medications or, if you prefer, bring the medication bottles and we will list them.  Please bring your insurance card so that we may make a copy of it.  If your insurance requires a referral to see a specialist, please bring your referral form from your primary care physician.  Co-payments are due at the time of your visit and may be paid by cash, check or credit card.     Your office visit will consist of a consult with your physician (includes a physical exam), any laboratory testing he/she may order, scheduling of any necessary diagnostic testing (e.g. x-ray, ultrasound, CT-scan), and scheduling of a procedure (e.g. Endoscopy, Colonoscopy) if required.  Please allow enough time on your schedule to allow for any/all of these possibilities.    If you cannot keep your appointment, please call [phone] to cancel or reschedule prior to your appointment date.  This allows [place] the opportunity to schedule an appointment for another patient in need of care.  If you do not cancel or reschedule by 5 p.m. the business day prior to your appointment date, you will be charged a $50.00 late cancellation/no-show fee.    Thank you for choosing  [facility] for your medical needs.  We appreciate the opportunity to care for you.  Please visit [place] at our website  to learn more about our practice.                     Sincerely,                                                             The Gastroenterology Division

## 2010-09-10 NOTE — Assessment & Plan Note (Signed)
Summary: 4 MOS F/U // # / CD   Vital Signs:  Patient profile:   77 year old female Height:      66 inches (167.64 cm) Weight:      165.25 pounds (75.11 kg) BMI:     26.77 O2 Sat:      97 % on Room air Temp:     98.2 degrees F (36.78 degrees C) oral Pulse rate:   98 / minute BP sitting:   134 / 86  (left arm) Cuff size:   regular  Vitals Entered By: Lucious Groves (January 23, 2010 9:25 AM)  O2 Flow:  Room air CC: 4 mo f/u./kb Is Patient Diabetic? No Pain Assessment Patient in pain? no        Primary Care Provider:  Jacinta Shoe, MD  CC:  4 mo f/u./kb.  History of Present Illness: F/u abn CBC, abn LFTs, HTN, stress.   Current Medications (verified): 1)  Ativan 1 Mg Tabs (Lorazepam) .Marland Kitchen.. 1 Two Times A Day As Needed 2)  Norvasc 5 Mg Tabs (Amlodipine Besylate) .Marland Kitchen.. 1 Once Daily 3)  Synthroid 25 Mcg Tabs (Levothyroxine Sodium) .Marland Kitchen.. 1 Once Daily 4)  Baby Aspirin 81 Mg  Chew (Aspirin) .... Once Daily 5)  Vitamin D3 1000 Unit  Tabs (Cholecalciferol) .Marland Kitchen.. 1 By Mouth Daily 6)  Triamcinolone Acetonide 0.5 % Crea (Triamcinolone Acetonide) .... Apply Bid To Affected Area  Allergies (verified): 1)  ! Codeine Sulfate (Codeine Sulfate) 2)  ! Celexa (Citalopram Hydrobromide) 3)  Fioricet (Butalbital-Apap-Caffeine) 4)  Nortriptyline Hcl  Past History:  Past Medical History: Last updated: 12/10/2009 Hypertension Anxiety Hypothyroidism Low WBC, PLT since 2003 Dr Arline Asp Hyperlipidemia Irritable bladder Low back pain, LLE pain Depression Elev LFTs 2010 abd [place] nl Deviated septum Adenomatous Colon Polyps 05/2002  Past Surgical History: Last updated: 12/06/2009 Hysterectomy Bladder tack  Social History: Last updated: 01/23/2010 Retired Married alcoholic husband, she moved out 2011 Never Smoked Daily Caffeine Use -3 Illicit Drug Use - no  Social History: Retired Married alcoholic husband, she moved out 2011 Never Smoked Daily Caffeine Use -3 Illicit Drug Use -  no  Review of Systems  The patient denies fever, hoarseness, dyspnea on exertion, and abdominal pain.    Physical Exam  General:  Well-developed,well-nourished,in no acute distress; alert,appropriate and cooperative throughout examination Nose:  AK x2 on nose Mouth:  WNL Neck:  Cervical spine is tender to palpation over paraspinal muscles and with the ROM  Lungs:  Normal respiratory effort, chest expands symmetrically. Lungs are clear to auscultation, no crackles or wheezes. Heart:  Normal rate and regular rhythm. S1 and S2 normal without gallop, murmur, click, rub or other extra sounds. Abdomen:  Bowel sounds positive,abdomen soft and non-tender without masses, organomegaly or hernias noted. Msk:  No deformity or scoliosis noted of thoracic or lumbar spine.   Extremities:  No clubbing, cyanosis, edema, or deformity noted with normal full range of motion of all joints.   Neurologic:  No cranial nerve deficits noted. Station and gait are normal. Plantar reflexes are down-going bilaterally. DTRs are symmetrical throughout. Sensory, motor and coordinative functions appear intact. Skin:  aging changes 2 AKs on nose Psych:  Cognition and judgment appear intact. Alert and cooperative with normal attention span and concentration. No apparent delusions, illusions, hallucinations   Impression & Recommendations:  Problem # 1:  HYPOKALEMIA (ICD-276.8) Assessment Comment Only Repeat labs  Problem # 2:  CBC, ABNORMAL (ICD-790.99) Assessment: Comment Only  Orders: TLB-BMP (Basic Metabolic Panel-BMET) (80048-METABOL) TLB-Hepatic/Liver  Function Pnl (80076-HEPATIC) TLB-TSH (Thyroid Stimulating Hormone) (84443-TSH) TLB-CBC Platelet - w/Differential (85025-CBCD)  Problem # 3:  ABNORMAL LIVER FUNCTION TESTS (ICD-794.8) fatty liver Assessment: Comment Only Cont w/wt loss and diet  Problem # 4:  ESSENTIAL HYPERTENSION, BENIGN (ICD-401.1) Assessment: Unchanged  Her updated medication list for  this problem includes:    Norvasc 5 Mg Tabs (Amlodipine besylate) .Marland Kitchen... 1 once daily  Problem # 5:  NEOPLASM, SKIN, UNCERTAIN BEHAVIOR (ICD-238.2) AK Assessment: Unchanged Cryo suggested  Complete Medication List: 1)  Ativan 1 Mg Tabs (Lorazepam) .Marland Kitchen.. 1 two times a day as needed 2)  Norvasc 5 Mg Tabs (Amlodipine besylate) .Marland Kitchen.. 1 once daily 3)  Synthroid 25 Mcg Tabs (Levothyroxine sodium) .Marland Kitchen.. 1 once daily 4)  Baby Aspirin 81 Mg Chew (Aspirin) .... Once daily 5)  Vitamin D3 1000 Unit Tabs (Cholecalciferol) .Marland Kitchen.. 1 by mouth daily 6)  Triamcinolone Acetonide 0.5 % Crea (Triamcinolone acetonide) .... Apply bid to affected area  Patient Instructions: 1)  Please schedule a follow-up appointment in 4 months. 2)  BMP prior to visit, ICD-9: 3)  Hepatic Panel prior to visit, ICD-9: 4)  TSH prior to visit, ICD-9: 5)  CBC w/ Diff prior to visit, ICD-9:790.29  995.20 6)  HbgA1C prior to visit, ICD-9:

## 2010-09-10 NOTE — Procedures (Signed)
Summary: Colonoscopy   Colonoscopy  Procedure date:  09/25/2005  Findings:      Results: Normal. Location:  [facility].    Procedures Next Due Date:    Colonoscopy: 09/2010  Colonoscopy  Procedure date:  09/25/2005  Findings:      Results: Normal. Location:  [facility].    Procedures Next Due Date:    Colonoscopy: 09/2010 Patient Name: [name] [name], [name] [name] MRN:  Procedure Procedures: Colonoscopy CPT: 35009.  Personnel: Endoscopist: Venita Lick. Russella Dar, MD, Clementeen Graham.  Exam Location: Exam performed in Outpatient Clinic. Outpatient  Patient Consent: Procedure, Alternatives, Risks and Benefits discussed, consent obtained, from patient. Consent was obtained by the RN.  Indications  Surveillance of: Adenomatous Polyp(s). Initial polypectomy was performed in 2003. in Oct. Pathology of worst  polyp: tubular adenoma.  History  Current Medications: Patient is not currently taking Coumadin.  Pre-Exam Physical: Performed Sep 25, 2005. Cardio-pulmonary exam, Rectal exam, HEENT exam , Abdominal exam, Mental status exam WNL.  Comments: Pt. history reviewed/updated, physical exam performed prior to initiation of sedation?Yes Exam Exam: Extent of exam reached: Cecum, extent intended: Cecum.  The cecum was identified by appendiceal orifice and IC valve. The Cecum was reached at 10:42 AM. ended at 10:52 AM. Time for Withdrawl: 00:10. Colon retroflexion performed. ASA Classification: II. Tolerance: excellent.  Monitoring: Pulse and BP monitoring, Oximetry used. Supplemental O2 given.  Colon Prep Used MoviPrep for colon prep. Prep results: excellent.  Sedation Meds: Patient assessed and found to be appropriate for moderate (conscious) sedation. Fentanyl 75 mcg. given IV. Versed 8 mg. given IV.  Findings NORMAL EXAM: Cecum to Rectum.   Assessment Normal examination.  Events  Unplanned Interventions: No intervention was required.  Unplanned  Events: There were no complications. Plans  Post Exam Instructions: Post sedation instructions given.  Disposition: After procedure patient sent to recovery. After recovery patient sent home.  Scheduling/Referral: Colonoscopy, to [facility]. Russella Dar, MD, [facility], around Sep 25, 2010.    This report was created from the original endoscopy report, which was reviewed and signed by the above listed endoscopist.    cc: Linda Hedges. Plotnikov, MD

## 2010-09-10 NOTE — Assessment & Plan Note (Signed)
Summary: elevated LFT's--ch.   History of Present Illness Visit Type: Initial Consult Primary GI MD: Elie Goody MD [facility] Primary Provider: Jacinta Shoe, MD Requesting Provider: Jacinta Shoe, MD Chief Complaint: abnormal liver function tests, occasional GERD & constipation ongoing History of Present Illness:   This is a 77 year old white female with asymptomatic LFT elevations. Review of her chart reveals normal liver function tests documented in May 2009. AST = 45 and ALT= 50 in May 2010, AST= 96 and ALT = 100 in August 2010, AST= 69, ALT=62 in Feb 2011. Hepatitis A, B, C, ANA, ferritin all negative.  She relates that her sister has a history of cirrhosis followed by Dr. Leone Payor. The cause is not known.  ABD U/S 01/2009: Gallbladder:  Negative. Common bile duct:  3 mm, within normal limits. Liver:  Contains a 3.6 x 2.7 x 2.6 cm cyst, which may be minimally complex.  Otherwise negative.   GI Review of Systems    Reports acid reflux.      Denies abdominal pain, belching, bloating, chest pain, dysphagia with liquids, dysphagia with solids, heartburn, loss of appetite, nausea, vomiting, vomiting blood, weight loss, and  weight gain.      Reports constipation and  liver problems.     Denies anal fissure, black tarry stools, change in bowel habit, diarrhea, diverticulosis, fecal incontinence, heme positive stool, hemorrhoids, irritable bowel syndrome, jaundice, light color stool, rectal bleeding, and  rectal pain. Preventive Screening-Counseling & Management      Drug Use:  no.     Current Medications (verified): 1)  Ativan 1 Mg Tabs (Lorazepam) .Marland Kitchen.. 1 Two Times A Day As Needed 2)  Norvasc 5 Mg Tabs (Amlodipine Besylate) .Marland Kitchen.. 1 Once Daily 3)  Synthroid 25 Mcg Tabs (Levothyroxine Sodium) .Marland Kitchen.. 1 Once Daily 4)  Baby Aspirin 81 Mg  Chew (Aspirin) .... Once Daily 5)  Vitamin D3 1000 Unit  Tabs (Cholecalciferol) .Marland Kitchen.. 1 By Mouth Daily 6)  Triamcinolone Acetonide 0.5 % Crea  (Triamcinolone Acetonide) .... Apply Bid To Affected Area  Allergies (verified): 1)  ! Codeine Sulfate (Codeine Sulfate) 2)  ! Celexa (Citalopram Hydrobromide) 3)  Fioricet (Butalbital-Apap-Caffeine) 4)  Nortriptyline Hcl  Past History:  Past Medical History: Hypertension Anxiety Hypothyroidism Low WBC, PLT since 2003 Dr Arline Asp Hyperlipidemia Irritable bladder Low back pain, LLE pain Depression Elev LFTs 2010 abd [place] nl Deviated septum Adenomatous Colon Polyps 05/2002  Past Surgical History: Reviewed history from 12/06/2009 and no changes required. Hysterectomy Bladder tack  Family History: Family History Hypertension Sister has elev LFTs and cirrhosis Family History of Heart Disease: Parents No FH of Colon Cancer:  Social History: Retired Married alcoholic husband Never Smoked Daily Caffeine Use -3 Illicit Drug Use - no Drug Use:  no  Review of Systems       The patient complains of anxiety-new, arthritis/joint pain, back pain, and itching.  The patient denies allergy/sinus, anemia, blood in urine, breast changes/lumps, change in vision, confusion, cough, coughing up blood, depression-new, fainting, fatigue, fever, headaches-new, hearing problems, heart murmur, heart rhythm changes, menstrual pain, muscle pains/cramps, night sweats, nosebleeds, pregnancy symptoms, shortness of breath, skin rash, sleeping problems, sore throat, swelling of feet/legs, swollen lymph glands, thirst - excessive , urination - excessive , urination changes/pain, urine leakage, vision changes, and voice change.    Vital Signs:  Patient profile:   77 year old female Height:      66 inches Weight:      170.50 pounds BMI:     27.62  Pulse rate:   76 / minute Pulse rhythm:   regular BP sitting:   142 / 88  (left arm) Cuff size:   regular  Vitals Entered By: June McMurray CMA Duncan Dull) (Dec 10, 2009 2:15 PM)  Physical Exam  General:  Well developed, well nourished, no acute  distress. Head:  Normocephalic and atraumatic. Eyes:  PERRLA, no icterus. Ears:  Normal auditory acuity. Mouth:  No deformity or lesions, dentition normal. Neck:  Supple; no masses or thyromegaly. Lungs:  Clear throughout to auscultation. Heart:  Regular rate and rhythm; no murmurs, rubs,  or bruits. Abdomen:  Soft, nontender and nondistended. No masses, hepatosplenomegaly or hernias noted. Normal bowel sounds. Msk:  Symmetrical with no gross deformities. Normal posture. Pulses:  Normal pulses noted. Extremities:  No clubbing, cyanosis, edema or deformities noted. Neurologic:  Alert and  oriented x4;  grossly normal neurologically. Cervical Nodes:  No significant cervical adenopathy. Inguinal Nodes:  No significant inguinal adenopathy. Psych:  Alert and cooperative. Normal mood and affect.  Impression & Recommendations:  Problem # 1:  NONSPEC ELEVATION OF LEVELS OF TRANSAMINASE/LDH (ICD-790.4) Mildly elevated transaminases, which have persisted for one year. Etiology is not clear. Standard serologic evaluation. Repeat abdominal ultrasound. She has had leukopenia and thrombocytopenia for several years. Rule out hypersplenism, and underlying cirrhosis. Attempt to obtain a release to review her sisters medical records. Orders: TLB-Iron, (Fe) Total (83540-FE) TLB-IBC Pnl (Iron/FE;Transferrin) (83550-IBC) TLB-PT (Protime) (85610-PTP) T-Alpha-1-Antitrypsin Tot [phone]) T-AMA [phone]) T-Anti SMA ([id number]) T-Ceruloplasmin [phone]) T-Angiotensin i-Converting Enzyme (72536-64403) Ultrasound Abdomen (UAS)  Problem # 2:  PERSONAL HX COLONIC POLYPS (ICD-V12.72) Adenomatous colon polyps, initially diagnosed in October 2003. She is due for surveillance colonoscopy in February 2012.  Problem # 3:  ABNORMAL FINDINGS GI TRACT (ICD-793.4) Benign appearing hepatic cyst. Reevaluation with abdominal ultrasound. Consider further evaluation with a CT or MR based on ultrasound  findings.  Patient Instructions: 1)  Get your labs drawn today in the basement.  2)  You have been scheduled for a abdominal ultrasound. 3)  Please schedule a follow-up appointment in 4  weeks.  4)  Copy sent to : Jacinta Shoe, MD 5)  The medication list was reviewed and reconciled.  All changed / newly prescribed medications were explained.  A complete medication list was provided to the patient / caregiver.

## 2010-09-10 NOTE — Assessment & Plan Note (Signed)
Summary: Gastroenterology  Brittnee  MR#:  [id number] Page #  [facility]   GASTROENTEROLOGY OFFICE NOTE  NAME:  [name] [name], [name] [name]   OFFICE NO:  [id number]  DATE:  05/18/02  DOB: [birth year]   Referring Physician:  Claretta Fraise, M.D.  REASON FOR REFERRAL:  Left lower quadrant pain and colorectal cancer screening.    HISTORY OF PRESENT ILLNESS:  The patient is a very nice 77 year old white female referred through the courtesy of Dr. Baldo Daub.  She states she recently developed some mild left lower quadrant pain after beginning Oxytrol for urinary bladder symptoms.  Since discontinuing Oxytrol, these symptoms have abated.  She has mild intermittent back pain that she feels is musculoskeletal in origin.  She has no variation in bowel habits, melena, hematochezia, change in stool caliber, or rectal pain.  There is no family history of colon cancer, colon polyps or inflammatory bowel disease.    PAST MEDICAL HISTORY:  Status post hysterectomy, history of deviated septum, status post bladder tack, history of cardiac catheterization that was normal, hypertension.  CURRENT MEDICATIONS:  Listed on the front of the chart, updated and reviewed.    ALLERGIES:  No known drug allergies.    PHYSICAL EXAMINATION:  General:  No acute distress.  Vital Signs:  Weight:  156 pounds.  Blood Pressure:  122/80.  Pulse:  80 and regular.  HEENT:  Anicteric sclerae.  Oropharynx clear.  Chest:  Clear to auscultation and percussion.  Cardiac:  Regular rate and rhythm without murmurs.  Abdomen:  Soft, nontender, nondistended, normoactive bowel sounds.  No palpable organomegaly, masses or hernias.  Rectal:  Exam deferred to time of colonoscopy.  Extremities:  Without clubbing, cyanosis or edema.  Neurologic:  Alert and oriented x3.    ASSESSMENT/PLAN:   Mild left lower quadrant pain.  Probable side effect of Oxytrol.  Colorectal cancer screening.  Rule out colorectal neoplasm.  risks, benefits, and alternatives to  colonoscopy with possible polypectomy discussed with the patient.  She consents to proceed.  This will be scheduled electively.     Venita Lick. Russella Dar, M.D., F.A.C.G.  QVZ/DGL875 cc:  Claretta Fraise, M.D. D:  05/18/02; T:  ; Job [id number]

## 2010-09-10 NOTE — Procedures (Signed)
Summary: Colonoscopy   Colonoscopy  Procedure date:  06/08/2002  Findings:      Pathology:  Adenomatous polyp.        Location:  [facility].    Procedures Next Due Date:    Colonoscopy: 06/2005 Patient Name: [name], [name] MRN:  Procedure Procedures: Colonoscopy CPT: [address].    with Hot Biopsy(s)CPT: Z451292.  Personnel: Endoscopist: Venita Lick. Russella Dar, MD, Clementeen Graham.  Referred By: Judie Petit. Nicanor Bake, MD.  Exam Location: Exam performed in Outpatient Clinic. Outpatient  Patient Consent: Procedure, Alternatives, Risks and Benefits discussed, consent obtained, from patient. Consent was obtained by the RN.  Indications Symptoms: Abdominal pain / bloating.  Average Risk Screening Routine.  History  Pre-Exam Physical: Performed Jun 08, 2002. Entire physical exam was normal.  Exam Exam: Extent of exam reached: Cecum, extent intended: Cecum.  The cecum was identified by appendiceal orifice and IC valve. Colon retroflexion performed. ASA Classification: II. Tolerance: good.  Monitoring: Pulse and BP monitoring, Oximetry used. Supplemental O2 given.  Colon Prep Used Golytely for colon prep. Prep results: good.  Sedation Meds: Patient assessed and found to be appropriate for moderate (conscious) sedation. Fentanyl 100 mcg. given IV. Versed 5 mg. given IV.  Findings NORMAL EXAM: Cecum to Descending Colon.  MULTIPLE POLYPS: Sigmoid Colon. minimum size 2 mm, maximum size 5 mm. Procedure:  hot biopsy, removed, Polyp retrieved, 2 polyps Polyps sent to pathology. ICD9: Colon Polyps: 211.3.  NORMAL EXAM: Rectum.   Assessment  Diagnoses: 211.3: Colon Polyps.   Events  Unplanned Interventions: No intervention was required.  Unplanned Events: There were no complications. Plans  Post Exam Instructions: No aspirin or non-steroidal containing medications: 2 weeks.  Medication Plan: Await pathology. Continue current medications.  Patient Education: Patient given  standard instructions for: Polyps.  Disposition: After procedure patient sent to recovery. After recovery patient sent home.  Scheduling/Referral: Colonoscopy, to [facility] T. Russella Dar, MD, [facility], if polyp(s) adenomatous, around Jun 08, 2005.  Primary Care Provider,    This report was created from the original endoscopy report, which was reviewed and signed by the above listed endoscopist.    cc: M. Nicanor Bake, MD

## 2010-09-10 NOTE — Letter (Signed)
Summary: [name] Consult Scheduled Letter  [facility]  [address] [place], [place] [address]   Phone: [phone]  Fax: [phone]      09/27/2009 MRN: [id number]  [name] [name] [address] [place], [place]  [address]    Dear Ms. Mccorkle,      We have scheduled an appointment for you.  At the recommendation of Dr.Plotnikov, we have scheduled you a consult with Dr Russella Dar on 10/30/09 at 1:45pm.  Their phone number is [phone].  If this appointment day and time is not convenient for you, please feel free to call the office of the doctor you are being referred to at the number listed above and reschedule the appointment.    [facility] [address] [place], [place] [address] *Gastroenterology Dept.3rd Floor*    Please give 24hr notice if you need to cancel/reschedule to avoid a $50.00 fee.Also bring insurance card and any co-pay due at time of visit.    Thank you,  Patient Care Coordinator [facility]

## 2010-09-10 NOTE — Assessment & Plan Note (Signed)
Summary: F/U APPT...LSW.   History of Present Illness Visit Type: Follow-up Visit Primary GI MD: Elie Goody MD [facility] Primary Provider: Jacinta Shoe, MD Requesting Provider: na Chief Complaint: Patient here following up on her abnormal LFTs, she had them drawn on 6-15 at Dr. Adah Perl office.  She denies any GI complaints.  History of Present Illness:   This is a return office visit for abnormal liver function tests, felt secondary to fatty liver. Recent blood work by Dr. Posey Rea showed an AST of 52 and an ALT of 41. Both have improved. She has no gastrointestinal complaints. The results of her evaluation were reviewed with her as well as standard recommendations for long-term treatment of fatty liver.   GI Review of Systems      Denies abdominal pain, acid reflux, belching, bloating, chest pain, dysphagia with liquids, dysphagia with solids, heartburn, loss of appetite, nausea, vomiting, vomiting blood, weight loss, and  weight gain.        Denies anal fissure, black tarry stools, change in bowel habit, constipation, diarrhea, diverticulosis, fecal incontinence, heme positive stool, hemorrhoids, irritable bowel syndrome, jaundice, light color stool, liver problems, rectal bleeding, and  rectal pain.   Current Medications (verified): 1)  Ativan 1 Mg Tabs (Lorazepam) .Marland Kitchen.. 1 Two Times A Day As Needed 2)  Norvasc 5 Mg Tabs (Amlodipine Besylate) .Marland Kitchen.. 1 Once Daily 3)  Synthroid 25 Mcg Tabs (Levothyroxine Sodium) .Marland Kitchen.. 1 Once Daily 4)  Baby Aspirin 81 Mg  Chew (Aspirin) .... Once Daily 5)  Vitamin D3 1000 Unit  Tabs (Cholecalciferol) .Marland Kitchen.. 1 By Mouth Daily 6)  Triamcinolone Acetonide 0.5 % Crea (Triamcinolone Acetonide) .... Apply Bid To Affected Area As Needed  Allergies (verified): 1)  ! Codeine Sulfate (Codeine Sulfate) 2)  ! Celexa (Citalopram Hydrobromide) 3)  Fioricet (Butalbital-Apap-Caffeine) 4)  Nortriptyline Hcl  Past History:  Past Medical  History: Hypertension Anxiety Hypothyroidism Low WBC, PLT since 2003 Dr Arline Asp Hyperlipidemia Irritable bladder Low back pain, LLE pain Depression Elev LFTs 2010 abd [place] nl Deviated septum Adenomatous Colon Polyps 05/2002 Hepatic steatosis Hepatic cyst, 4cm  Past Surgical History: Reviewed history from 12/06/2009 and no changes required. Hysterectomy Bladder tack  Family History: Reviewed history from 12/10/2009 and no changes required. Family History Hypertension Sister has elev LFTs and cirrhosis Family History of Heart Disease: Parents No FH of Colon Cancer:  Social History: Reviewed history from 01/23/2010 and no changes required. Retired Married alcoholic husband, she moved out 2011 Never Smoked Daily Caffeine Use -3 Illicit Drug Use - no  Review of Systems       The patient complains of anxiety-new, arthritis/joint pain, back pain, and itching.         The pertinent positives and negatives are noted as above and in the HPI. All other ROS were reviewed and were negative.   Vital Signs:  Patient profile:   77 year old female Height:      66 inches Weight:      166.2 pounds BMI:     26.92 Pulse rate:   90 / minute Pulse rhythm:   regular BP sitting:   132 / 78  (left arm) Cuff size:   regular  Vitals Entered By: Harlow Mares CMA Duncan Dull) (January 30, 2010 9:54 AM)  Physical Exam  General:  Well developed, well nourished, no acute distress. Head:  Normocephalic and atraumatic. Eyes:  PERRLA, no icterus. Mouth:  No deformity or lesions, dentition normal. Lungs:  Clear throughout to auscultation. Heart:  Regular rate  and rhythm; no murmurs, rubs,  or bruits. Abdomen:  Soft, nontender and nondistended. No masses, hepatosplenomegaly or hernias noted. Normal bowel sounds. Psych:  Alert and cooperative. Normal mood and affect.  Impression & Recommendations:  Problem # 1:  FATTY LIVER DISEASE (ICD-571.8) Long-term recommendations a low-fat diet, weight  loss, to an ideal body weight and appropriate management of lipids discussed in detail with the patient. She states she is also made substantial dietary changes and her liver function tests have improved. Monitor liver function tests every 6 months with her primary physician. I will see her back in a year.  Problem # 2:  ABNORMAL FINDINGS GI TRACT (ICD-793.4) Slightly complex hepatic cyst, which does not appear to have changed on imaging studies indicating a benign etiology. Repeat hepatic imaging 12/2010.  Problem # 3:  PERSONAL HX COLONIC POLYPS (ICD-V12.72) Surveillance colonoscopy recommended February 2012.  Patient Instructions: 1)  Please schedule a follow-up appointment in 1 year. 2)  Please continue current medications.  3)  Copy sent to : Dr Trinna Post Plotnikov 4)  The medication list was reviewed and reconciled.  All changed / newly prescribed medications were explained.  A complete medication list was provided to the patient / caregiver.  Appended Document: F/U APPT...LSW. Of note, Dr Russella Dar would like patient to have a follow up abdominal ultrasound around 12/2010 for follow up liver cyst.

## 2010-09-10 NOTE — Letter (Signed)
Summary: Results Letter  [facility]  [address] [place], [place] [address]   Phone: [phone]  Fax: [phone]        Dec 12, 2009 MRN: [id number]    [facility] [address] [place], [place]  [address]    Dear [name],   I have been unable to reach you by phone.  Dr Russella Dar has reviewed your abdominal ultrasound and it shows you have fatty liver.  Dr Russella Dar recommends a low fat diet, exercise and weight loss.   If you have any questions please call us at [phone].          Sincerely,  Darcey Nora RN, CGRN  This letter has been electronically signed by your physician.

## 2010-09-12 NOTE — Letter (Signed)
Summary: Colonoscopy Letter  [facility]  [address] [place], [place] [address]   Phone: [phone]  Fax: [phone]      September 04, 2010 MRN: [id number]   [facility] [address] [place], [place]  [address]   Dear Ms. Mcadory,   According to your medical record, it is time for you to schedule a Colonoscopy. The American Cancer Society recommends this procedure as a method to detect early colon cancer. Patients with a family history of colon cancer, or a personal history of colon polyps or inflammatory bowel disease are at increased risk.  This letter has been generated based on the recommendations made at the time of your procedure. If you feel that in your particular situation this may no longer apply, please contact our office.  Please call our office at [phone] to schedule this appointment or to update your records at your earliest convenience.  Thank you for cooperating with [place] to provide you with the very best care possible.   Sincerely,    Judie Petit T. Russella Dar, M.D.  [facility] Gastroenterology Division [phone]

## 2010-09-13 ENCOUNTER — Encounter (INDEPENDENT_AMBULATORY_CARE_PROVIDER_SITE_OTHER): Payer: Self-pay | Admitting: *Deleted

## 2010-09-16 ENCOUNTER — Telehealth: Payer: Self-pay | Admitting: Internal Medicine

## 2010-09-16 ENCOUNTER — Encounter: Payer: Self-pay | Admitting: Gastroenterology

## 2010-09-18 NOTE — Letter (Signed)
Summary: Pre Visit Letter Revised  [facility]  [address] [place], [place] [address]   Phone: [phone]  Fax: [phone]        09/09/2010 MRN: [id number] [name] [name] [address] [place], [place]  [address]                          Procedure Date:  September 30, 2010                 recall col-Dr Annamarie Major to the Gastroenterology Division at [facility].    You are scheduled to see a nurse for your pre-procedure visit on September 16, 2010 at 1:00pm on the 3rd floor at [facility], [address]  We ask that you try to arrive at our office 15 minutes prior to your appointment time to allow for check-in.  Please take a minute to review the attached form.  If you answer "Yes" to one or more of the questions on the first page, we ask that you call the person listed at your earliest opportunity.  If you answer "No" to all of the questions, please complete the rest of the form and bring it to your appointment.    Your nurse visit will consist of discussing your medical and surgical history, your immediate family medical history, and your medications.   If you are unable to list all of your medications on the form, please bring the medication bottles to your appointment and we will list them.  We will need to be aware of both prescribed and over the counter drugs.  We will need to know exact dosage information as well.    Please be prepared to read and sign documents such as consent forms, a financial agreement, and acknowledgement forms.  If necessary, and with your consent, a friend or relative is welcome to sit-in on the nurse visit with you.  Please bring your insurance card so that we may make a copy of it.  If your insurance requires a referral to see a specialist, please bring your referral form from your primary care physician.  No co-pay is required for this nurse visit.     If you cannot keep your appointment, please call  [phone] to cancel or reschedule prior to your appointment date.  This allows [place] the opportunity to schedule an appointment for another patient in need of care.    Thank you for choosing [facility] for your medical needs.  We appreciate the opportunity to care for you.  Please visit [place] at our website  to learn more about our practice.  Sincerely, The Gastroenterology Division

## 2010-09-26 NOTE — Progress Notes (Signed)
  Phone Note Refill Request   Refills Requested: Medication #1:  NORVASC 5 MG TABS 1 once daily  Medication #2:  SYNTHROID 25 MCG TABS 1 once daily TO go to [facility]  Initial call taken by: Lamar Sprinkles, CMA,  September 16, 2010 12:12 PM    Prescriptions: SYNTHROID 25 MCG TABS (LEVOTHYROXINE SODIUM) 1 once daily  #90 x 1   Entered by:   Lamar Sprinkles, CMA   Authorized by:   Tresa Garter MD   Signed by:   Lamar Sprinkles, CMA on 09/16/2010   Method used:   Electronically to        [facility]* (retail)       [address] [place], [place]  [address]       Ph: [phone]       Fax: [phone]   RxID:   [id number] NORVASC 5 MG TABS (AMLODIPINE BESYLATE) 1 once daily  #90 x 1   Entered by:   Lamar Sprinkles, CMA   Authorized by:   Tresa Garter MD   Signed by:   Lamar Sprinkles, CMA on 09/16/2010   Method used:   Electronically to        [facility]* (retail)       [address] [place], [place]  [address]       Ph: [phone]       Fax: [phone]   RxID:   [id number]

## 2010-09-26 NOTE — Miscellaneous (Signed)
Summary: [facility] Previsit/prep  Clinical Lists Changes  Medications: Added new medication of MOVIPREP 100 GM  SOLR (PEG-KCL-NACL-NASULF-NA ASC-C) As per prep instructions. - Signed Rx of MOVIPREP 100 GM  SOLR (PEG-KCL-NACL-NASULF-NA ASC-C) As per prep instructions.;  #1 x 0;  Signed;  Entered by: Wyona Almas RN;  Authorized by: Meryl Dare MD Clementeen Graham;  Method used: Electronically to [facility] [address] [place], [place], [place]  [address], Ph: [phone] or [phone], Fax: [phone] Allergies: Changed allergy or adverse reaction from CODEINE SULFATE (CODEINE SULFATE) to CODEINE SULFATE (CODEINE SULFATE) Observations: Added new observation of ALLERGY REV: Done (09/16/2010 12:22)    Prescriptions: MOVIPREP 100 GM  SOLR (PEG-KCL-NACL-NASULF-NA ASC-C) As per prep instructions.  #1 x 0   Entered by:   Wyona Almas RN   Authorized by:   Meryl Dare MD [facility]   Signed by:   Wyona Almas RN on 09/16/2010   Method used:   Electronically to        [facility]ail[facility]       [address]       [place], [place]  [address]       Ph: [phone] or [phone]       Fax: [phone]   RxID:   [id number]

## 2010-09-26 NOTE — Letter (Signed)
Summary: [facility] Instructions  [facility]  [address] [place], [place] [address]   Phone: [phone]  Fax: [phone]       [name] [name]    Jul 13, 1933    MRN: [id number]        Procedure Day Dorna Bloom:  Duanne Limerick  09/30/10     Arrival Time:  1:00PM      Procedure Time:  2:00PM     Location of Procedure:                    Juliann Pares _  [facility] (4th Floor)                      PREPARATION FOR COLONOSCOPY WITH MOVIPREP   Starting 5 days prior to your procedure 09/25/10 do not eat nuts, seeds, popcorn, corn, beans, peas,  salads, or any raw vegetables.  Do not take any fiber supplements (e.g. Metamucil, Citrucel, and Benefiber).  THE DAY BEFORE YOUR PROCEDURE         DATE: 09/29/10  DAY: SUNDAY  1.  Drink clear liquids the entire day-NO SOLID FOOD  2.  Do not drink anything colored red or purple.  Avoid juices with pulp.  No orange juice.  3.  Drink at least 64 oz. (8 glasses) of fluid/clear liquids during the day to prevent dehydration and help the prep work efficiently.  CLEAR LIQUIDS INCLUDE: Water Jello Ice Popsicles Tea (sugar ok, no milk/cream) Powdered fruit flavored drinks Coffee (sugar ok, no milk/cream) Gatorade Juice: apple, white grape, white cranberry  Lemonade Clear bullion, consomm, broth Carbonated beverages (any kind) Strained chicken noodle soup Hard Candy                             4.  In the morning, mix first dose of MoviPrep solution:    Empty 1 Pouch A and 1 Pouch B into the disposable container    Add lukewarm drinking water to the top line of the container. Mix to dissolve    Refrigerate (mixed solution should be used within 24 hrs)  5.  Begin drinking the prep at 5:00 p.m. The MoviPrep container is divided by 4 marks.   Every 15 minutes drink the solution down to the next mark (approximately 8 oz) until the full liter is complete.   6.  Follow completed prep with 16 oz of clear liquid of your choice (Nothing  red or purple).  Continue to drink clear liquids until bedtime.  7.  Before going to bed, mix second dose of MoviPrep solution:    Empty 1 Pouch A and 1 Pouch B into the disposable container    Add lukewarm drinking water to the top line of the container. Mix to dissolve    Refrigerate  THE DAY OF YOUR PROCEDURE      DATE: 09/30/10   DAY: MONDAY  Beginning at 9:00AM (5 hours before procedure):         1. Every 15 minutes, drink the solution down to the next mark (approx 8 oz) until the full liter is complete.  2. Follow completed prep with 16 oz. of clear liquid of your choice.    3. You may drink clear liquids until 12:00PM (2 HOURS BEFORE PROCEDURE).   MEDICATION INSTRUCTIONS  Unless otherwise instructed, you should take regular prescription medications with a small sip of water   as early as possible the morning  of your procedure.         OTHER INSTRUCTIONS  You will need a responsible adult at least 75 years of age to accompany you and drive you home.   This person must remain in the waiting room during your procedure.  Wear loose fitting clothing that is easily removed.  Leave jewelry and other valuables at home.  However, you may wish to bring a book to read or  an iPod/MP3 player to listen to music as you wait for your procedure to start.  Remove all body piercing jewelry and leave at home.  Total time from sign-in until discharge is approximately 2-3 hours.  You should go home directly after your procedure and rest.  You can resume normal activities the  day after your procedure.  The day of your procedure you should not:   Drive   Make legal decisions   Operate machinery   Drink alcohol   Return to work  You will receive specific instructions about eating, activities and medications before you leave.    The above instructions have been reviewed and explained to me by   Wyona Almas RN  September 16, 2010 12:55 PM     I fully understand and  can verbalize these instructions _____________________________ Date _________

## 2010-09-30 ENCOUNTER — Other Ambulatory Visit: Payer: Self-pay | Admitting: Gastroenterology

## 2010-09-30 ENCOUNTER — Other Ambulatory Visit (AMBULATORY_SURGERY_CENTER): Payer: Medicare PPO | Admitting: Gastroenterology

## 2010-09-30 DIAGNOSIS — D126 Benign neoplasm of colon, unspecified: Secondary | ICD-10-CM

## 2010-09-30 DIAGNOSIS — Z1211 Encounter for screening for malignant neoplasm of colon: Secondary | ICD-10-CM

## 2010-09-30 DIAGNOSIS — Z86010 Personal history of colonic polyps: Secondary | ICD-10-CM

## 2010-10-03 ENCOUNTER — Encounter: Payer: Self-pay | Admitting: Gastroenterology

## 2010-10-08 NOTE — Procedures (Addendum)
Summary: Colonoscopy  Patient: [name] [name] Note: All result statuses are Final unless otherwise noted.  Tests: (1) Colonoscopy (COL)   COL Colonoscopy           DONE     [facility]     [address]     [place], [place]  [address]           COLONOSCOPY PROCEDURE REPORT           PATIENT:  [name], [name]  MR#:  [id number]     BIRTHDATE:  May 04, 1933, 77 yrs. old  GENDER:  female     ENDOSCOPIST:  Judie Petit T. Russella Dar, MD, [facility]           PROCEDURE DATE:  09/30/2010     PROCEDURE:  Colonoscopy with biopsy     ASA CLASS:  Class II     INDICATIONS:  1) surveillance and high-risk screening  2) history     of pre-cancerous (adenomatous) colon polyps: 05/2002.     MEDICATIONS:   Fentanyl 75 mcg IV, Versed 7 mg IV     DESCRIPTION OF PROCEDURE:   After the risks benefits and     alternatives of the procedure were thoroughly explained, informed     consent was obtained.  Digital rectal exam was performed and     revealed no abnormalities.   The LB [id number]-[id number] endoscope     was introduced through the anus and advanced to the cecum, which     was identified by both the appendix and ileocecal valve, limited     by a tortuous colon. The quality of the prep was good, using     MoviPrep.  The instrument was then slowly withdrawn as the colon     was fully examined.     <<PROCEDUREIMAGES>>     FINDINGS:  A sessile polyp was found in the ascending colon. It     was 4 mm in size. The polyp was removed using cold biopsy forceps.     A sessile polyp was found in the mid transverse colon. It was 3 mm     in size. The polyp was removed using cold biopsy forceps. A normal     appearing cecum, ileocecal valve, and appendiceal orifice were     identified. The hepatic flexure, splenic flexure, descending,     sigmoid colon, and rectum appeared unremarkable. Retroflexed views     in the rectum revealed no abnormalities.  The time to cecum =  3.5     minutes. The scope was then withdrawn  (time =  11.5  min) from the     patient and the procedure completed.           COMPLICATIONS:  None           ENDOSCOPIC IMPRESSION:     1) 4 mm sessile polyp in the ascending colon     2) 3 mm sessile polyp in the mid transverse colon           RECOMMENDATIONS:     1) Await pathology results     2) Repeat Colonoscopy in 5 years pending pathology review           Terrionna Bridwell T. Russella Dar, MD, [facility]           CC:  Linda Hedges. Plotnikov, MD           n.     Rosalie DoctorJudie Petit T. Barak Bialecki at 09/30/2010 02:30 PM  [name], [name], [id number]  Note: An exclamation mark (!) indicates a result that was not dispersed into the flowsheet. Document Creation Date: 09/30/2010 2:30 PM _______________________________________________________________________  (1) Order result status: Final Collection or observation date-time: 09/30/2010 14:24 Requested date-time:  Receipt date-time:  Reported date-time:  Referring Physician:   Ordering Physician: Claudette Head [phone]) Specimen Source:  Source: Launa Grill Order Number: [id number] Lab site:   Appended Document: Colonoscopy     Procedures Next Due Date:    Colonoscopy: 10/2015

## 2010-10-08 NOTE — Letter (Signed)
Summary: Patient Notice- Polyp Results  [facility]  [address] [place], [place] [address]   Phone: [phone]  Fax: [phone]        October 03, 2010 MRN: [id number]    [facility] [address] [place], [place]  [address]    Dear Ms. Weckwerth,  I am pleased to inform you that the colon polyp(s) removed during your recent colonoscopy was (were) found to be benign (no cancer detected) upon pathologic examination.  I recommend you have a repeat colonoscopy examination in 5 years to look for recurrent polyps, as having colon polyps increases your risk for having recurrent polyps or even colon cancer in the future.  Should you develop new or worsening symptoms of abdominal pain, bowel habit changes or bleeding from the rectum or bowels, please schedule an evaluation with either your primary care physician or with me.  Continue treatment plan as outlined the day of your exam.  Please call us if you are having persistent problems or have questions about your condition that have not been fully answered at this time.  Sincerely,  Meryl Dare MD [facility]  This letter has been electronically signed by your physician.  Appended Document: Patient Notice- Polyp Results letter mailed

## 2010-11-29 ENCOUNTER — Telehealth: Payer: Self-pay

## 2010-11-29 DIAGNOSIS — K7689 Other specified diseases of liver: Secondary | ICD-10-CM

## 2010-11-29 NOTE — Telephone Encounter (Signed)
Pt has been scheduled for a abdominal ultrasound at [facility] on 12/11/10 at 9:00am arriving at 8:45am. Pt notified and verbalized understanding.

## 2010-12-04 ENCOUNTER — Other Ambulatory Visit (INDEPENDENT_AMBULATORY_CARE_PROVIDER_SITE_OTHER): Payer: Medicare PPO | Admitting: Internal Medicine

## 2010-12-04 ENCOUNTER — Encounter: Payer: Self-pay | Admitting: Internal Medicine

## 2010-12-04 ENCOUNTER — Ambulatory Visit (INDEPENDENT_AMBULATORY_CARE_PROVIDER_SITE_OTHER): Payer: Medicare PPO | Admitting: Internal Medicine

## 2010-12-04 ENCOUNTER — Other Ambulatory Visit (INDEPENDENT_AMBULATORY_CARE_PROVIDER_SITE_OTHER): Payer: Medicare PPO

## 2010-12-04 VITALS — BP 120/86 | HR 78 | Temp 98.3°F | Resp 16 | Ht 66.0 in | Wt 163.0 lb

## 2010-12-04 DIAGNOSIS — D492 Neoplasm of unspecified behavior of bone, soft tissue, and skin: Secondary | ICD-10-CM

## 2010-12-04 DIAGNOSIS — Z0000 Encounter for general adult medical examination without abnormal findings: Secondary | ICD-10-CM

## 2010-12-04 DIAGNOSIS — Z136 Encounter for screening for cardiovascular disorders: Secondary | ICD-10-CM

## 2010-12-04 DIAGNOSIS — R945 Abnormal results of liver function studies: Secondary | ICD-10-CM

## 2010-12-04 DIAGNOSIS — L570 Actinic keratosis: Secondary | ICD-10-CM

## 2010-12-04 DIAGNOSIS — E785 Hyperlipidemia, unspecified: Secondary | ICD-10-CM

## 2010-12-04 LAB — TSH: TSH: 2.37 u[IU]/mL (ref 0.35–5.50)

## 2010-12-04 LAB — LIPID PANEL
HDL: 46.5 mg/dL (ref >39.00)
Total CHOL/HDL Ratio: 5
VLDL: 19 mg/dL (ref 0.0–40.0)

## 2010-12-04 LAB — COMPREHENSIVE METABOLIC PANEL
AST: 32 U/L (ref 0–37)
Albumin: 3.6 g/dL (ref 3.5–5.2)
Alkaline Phosphatase: 66 U/L (ref 39–117)
Potassium: 3.1 mEq/L — ABNORMAL LOW (ref 3.5–5.1)
Sodium: 141 mEq/L (ref 135–145)
Total Protein: 7 g/dL (ref 6.0–8.3)

## 2010-12-04 LAB — URINALYSIS, ROUTINE W REFLEX MICROSCOPIC
Bilirubin Urine: NEGATIVE
Total Protein, Urine: 30
Urine Glucose: NEGATIVE

## 2010-12-04 LAB — CBC WITH DIFFERENTIAL/PLATELET
Basophils Relative: 0.4 % (ref 0.0–3.0)
Eosinophils Absolute: 0.2 10*3/uL (ref 0.0–0.7)
Hemoglobin: 12.2 g/dL (ref 12.0–15.0)
Lymphocytes Relative: 35.3 % (ref 12.0–46.0)
MCHC: 34.4 g/dL (ref 30.0–36.0)
Monocytes Relative: 12.9 % — ABNORMAL HIGH (ref 3.0–12.0)
Neutro Abs: 1.8 10*3/uL (ref 1.4–7.7)
RBC: 4.27 Mil/uL (ref 3.87–5.11)

## 2010-12-04 NOTE — Progress Notes (Signed)
Subjective:    Patient ID: [name] [name], female    DOB: 1932-09-09, 77 y.o.   MRN: [id number]  HPI  The patient is here for a wellness exam. The patient has been doing well overall without major physical or psychological issues going on lately. The patient needs to address  chronic hypertension that has been well controlled with medicines; to address chronic  hyperlipidemia controlled with controlled with medical treatment and diet. She had R shoulder operated on.   Review of Systems  Constitutional: Negative.  Negative for fever, chills, diaphoresis, activity change, appetite change, fatigue and unexpected weight change.  HENT: Negative for hearing loss, ear pain, nosebleeds, congestion, sore throat, facial swelling, rhinorrhea, sneezing, mouth sores, trouble swallowing, neck pain, neck stiffness, postnasal drip, sinus pressure and tinnitus.   Eyes: Negative for pain, discharge, redness, itching and visual disturbance.  Respiratory: Negative for cough, chest tightness, shortness of breath, wheezing and stridor.   Cardiovascular: Negative for chest pain, palpitations and leg swelling.  Gastrointestinal: Negative for nausea, diarrhea, constipation, blood in stool, abdominal distention, anal bleeding and rectal pain.  Genitourinary: Negative for dysuria, urgency, frequency, hematuria, flank pain, vaginal bleeding, vaginal discharge, difficulty urinating, genital sores and pelvic pain.  Musculoskeletal: Negative for back pain, joint swelling, arthralgias and gait problem.  Skin: Negative.  Negative for rash.  Neurological: Negative for dizziness, tremors, seizures, syncope, speech difficulty, weakness, numbness and headaches.  Hematological: Negative for adenopathy. Does not bruise/bleed easily.  Psychiatric/Behavioral: Negative for suicidal ideas, behavioral problems, sleep disturbance, dysphoric mood and decreased concentration. The patient is not nervous/anxious.    BP Readings from Last 3  Encounters:  12/04/10 120/86  06/03/10 130/80  01/30/10 132/78       Objective:   Physical Exam  Constitutional: She appears well-developed and well-nourished. No distress.  HENT:  Head: Normocephalic.  Right Ear: External ear normal.  Left Ear: External ear normal.  Nose: Nose normal.  Mouth/Throat: Oropharynx is clear and moist.  Eyes: Conjunctivae are normal. Pupils are equal, round, and reactive to light. Right eye exhibits no discharge. Left eye exhibits no discharge.  Neck: Normal range of motion. Neck supple. No JVD present. No tracheal deviation present. No thyromegaly present.  Cardiovascular: Normal rate, regular rhythm and normal heart sounds.   Pulmonary/Chest: No stridor. No respiratory distress. She has no wheezes.  Abdominal: Soft. Bowel sounds are normal. She exhibits no distension and no mass. There is no tenderness. There is no rebound and no guarding.  Musculoskeletal: She exhibits tenderness. She exhibits no edema.       R shoulder is post-op  Lymphadenopathy:    She has no cervical adenopathy.  Neurological: She displays normal reflexes. No cranial nerve deficit. She exhibits normal muscle tone. Coordination normal.  Skin: No rash noted. No erythema.  Psychiatric: She has a normal mood and affect. Her behavior is normal. Judgment and thought content normal.       Lab Results  Component Value Date   WBC 3.8* 12/04/2010   HGB 12.2 12/04/2010   HCT 35.6* 12/04/2010   PLT 97.0* 12/04/2010   CHOL 234* 12/04/2010   TRIG 95.0 12/04/2010   HDL 46.50 12/04/2010   LDLDIRECT 172.4 12/04/2010   ALT 21 12/04/2010   AST 32 12/04/2010   NA 141 12/04/2010   K 3.1* 12/04/2010   CL 104 12/04/2010   CREATININE 0.8 12/04/2010   BUN 15 12/04/2010   CO2 27 12/04/2010   TSH 2.37 12/04/2010   INR 1.0 ratio 12/10/2009  HGBA1C 5.7 05/28/2010      Assessment & Plan:  Well adult exam The patient is here for annual Medicare wellness examination and management of other chronic and  acute problems.   The risk factors are reflected in the social history.  The roster of all physicians providing medical care to patient - is listed in the Snapshot section of the chart.  Activities of daily living:  The patient is 100% inedpendent in all ADLs: dressing, toileting, feeding as well as independent mobility  Home safety : The patient has smoke detectors in the home. They wear seatbelts.No firearms at home ( firearms are present in the home, kept in a safe fashion). There is no violence in the home.   There is no risks for hepatitis, STDs or HIV. There is no   history of blood transfusion. They have no travel history to infectious disease endemic areas of the world.  The patient has (has not) seen their dentist in the last six month. They have (not) seen their eye doctor in the last year. They deny (admit to) any hearing difficulty and have not had audiologic testing in the last year.  They do not  have excessive sun exposure. Discussed the need for sun protection: hats, long sleeves and use of sunscreen if there is significant sun exposure.   Diet: the importance of a healthy diet is discussed. They do have a healthy (unhealthy-high fat/fast food) diet.  The patient has a regular exercise program: ____PT 1___ , __h__duration, __2___per week.  The benefits of regular aerobic exercise were discussed.  Depression screen: there are no signs or vegative symptoms of depression- irritability, change in appetite, anhedonia, sadness/tearfullness.  Cognitive assessment: the patient manages all their financial and personal affairs and is actively engaged. They could relate day,date,year and events; recalled 3/3 objects at 3 minutes; performed clock-face test normally.  The following portions of the patient's history were reviewed and updated as appropriate: allergies, current medications, past family history, past medical history,  past surgical history, past social history  and problem  list.  Vision, hearing, body mass index were assessed and reviewed.   During the course of the visit the patient was educated and counseled about appropriate screening and preventive services including : fall prevention , diabetes screening, nutrition counseling, colorectal cancer screening, and recommended immunizations.  BP is better Labs ordered EKG is OK Mammogram is pending Colon - recent She declined breast exam and vaccines  ABNORMAL LIVER FUNCTION TESTS Repeat US is pending  AKs on nose - Derm consult

## 2010-12-04 NOTE — Assessment & Plan Note (Signed)
The patient is here for annual Medicare wellness examination and management of other chronic and acute problems.   The risk factors are reflected in the social history.  The roster of all physicians providing medical care to patient - is listed in the Snapshot section of the chart.  Activities of daily living:  The patient is 100% inedpendent in all ADLs: dressing, toileting, feeding as well as independent mobility  Home safety : The patient has smoke detectors in the home. They wear seatbelts.No firearms at home ( firearms are present in the home, kept in a safe fashion). There is no violence in the home.   There is no risks for hepatitis, STDs or HIV. There is no   history of blood transfusion. They have no travel history to infectious disease endemic areas of the world.  The patient has (has not) seen their dentist in the last six month. They have (not) seen their eye doctor in the last year. They deny (admit to) any hearing difficulty and have not had audiologic testing in the last year.  They do not  have excessive sun exposure. Discussed the need for sun protection: hats, long sleeves and use of sunscreen if there is significant sun exposure.   Diet: the importance of a healthy diet is discussed. They do have a healthy (unhealthy-high fat/fast food) diet.  The patient has a regular exercise program: ____PT 1___ , __h__duration, __2___per week.  The benefits of regular aerobic exercise were discussed.  Depression screen: there are no signs or vegative symptoms of depression- irritability, change in appetite, anhedonia, sadness/tearfullness.  Cognitive assessment: the patient manages all their financial and personal affairs and is actively engaged. They could relate day,date,year and events; recalled 3/3 objects at 3 minutes; performed clock-face test normally.  The following portions of the patient's history were reviewed and updated as appropriate: allergies, current medications, past  family history, past medical history,  past surgical history, past social history  and problem list.  Vision, hearing, body mass index were assessed and reviewed.   During the course of the visit the patient was educated and counseled about appropriate screening and preventive services including : fall prevention , diabetes screening, nutrition counseling, colorectal cancer screening, and recommended immunizations.

## 2010-12-04 NOTE — Assessment & Plan Note (Signed)
Repeat US is pending

## 2010-12-05 ENCOUNTER — Telehealth: Payer: Self-pay | Admitting: Internal Medicine

## 2010-12-05 ENCOUNTER — Other Ambulatory Visit: Payer: Self-pay | Admitting: Internal Medicine

## 2010-12-05 DIAGNOSIS — Z1231 Encounter for screening mammogram for malignant neoplasm of breast: Secondary | ICD-10-CM

## 2010-12-05 MED ORDER — POTASSIUM CHLORIDE ER 10 MEQ PO TBCR: 10.0000 meq | EXTENDED_RELEASE_TABLET | Freq: Two times a day (BID) | ORAL | Status: DC

## 2010-12-05 NOTE — Telephone Encounter (Signed)
Pt informed. Copies mailed 

## 2010-12-05 NOTE — Telephone Encounter (Signed)
[name] [name] , please, inform the patient: labs are OK, except fora little low K and elev chol.  Please, mail the labs to the patient.      Please, keep  next office visit appointment.   Thank you !

## 2010-12-11 ENCOUNTER — Ambulatory Visit (HOSPITAL_COMMUNITY)
Admission: RE | Admit: 2010-12-11 | Discharge: 2010-12-11 | Disposition: A | Discharge status: Home / Self Care | Payer: Medicare PPO | Source: Ambulatory Visit | Attending: Gastroenterology | Admitting: Gastroenterology

## 2010-12-11 ENCOUNTER — Ambulatory Visit (HOSPITAL_COMMUNITY)
Admission: RE | Admit: 2010-12-11 | Discharge: 2010-12-11 | Disposition: A | Discharge status: Home / Self Care | Payer: Medicare PPO | Source: Ambulatory Visit | Attending: Internal Medicine | Admitting: Internal Medicine

## 2010-12-11 DIAGNOSIS — K7689 Other specified diseases of liver: Secondary | ICD-10-CM | POA: Insufficient documentation

## 2010-12-11 DIAGNOSIS — Z1231 Encounter for screening mammogram for malignant neoplasm of breast: Secondary | ICD-10-CM | POA: Insufficient documentation

## 2010-12-11 DIAGNOSIS — N281 Cyst of kidney, acquired: Secondary | ICD-10-CM | POA: Insufficient documentation

## 2010-12-27 NOTE — Discharge Summary (Signed)
[facility]  Patient:    [name] [name], [name] [name]                      MRN: [id number] Proc. Date: 04/19/00 Adm. Date:  [id number] Disc. Date: [id number] Attending:  Angelena Sole.                           Discharge Summary  ADMISSION DIAGNOSIS:  Presyncope.  DISCHARGE DIAGNOSES: 1. Dehydration from viral syndrome. 2. Hypertension. 3. Drug reactions, specifically hypotension related to antihypertensive    medications.  HOSPITAL COURSE:  The patient is a 77 year old white female, who had a several day history of severe weakness and chills.  She was evaluated in the emergency room on Friday evening, and an ECG and UA was obtained.  She was told she had a viral syndrome and sent home.  She presented with chills without fever, nausea, vomiting, or diarrhea, mild myalgias and some abdominal discomfort. She had two loose bowel movements that were nonexplosive.  In the hospital she was given IV rehydration after she was found to be markedly orthostatic with a blood pressure lying of 150/110 and a pulse of 100, and standing her blood pressure dropped to 120/80.  She was symptomatic upon standing.  She was given 2 liters of IV fluids and hydration at 125 cc/h. overnight with excellent results and resolution of her orthostatics.  This morning she is able to stand without dizziness.  LABORATORY VALUES:  Within normal limits with the exception of a sedimentation rate which was elevated at 50, an ANA and rheumatoid factor were drawn to rule out rheumatoid disease; however, this most probably related to her viral syndrome.  CONDITION ON DISCHARGE:  The patient was discharged to home in improved condition.  INSTRUCTIONS:  She is instructed to continue hydration p.o.  Her blood pressure medicines were changed to Altace 5 mg p.o. q.d., Norvasc and hydrochlorothiazide were discontinued.  She is instructed to follow up with her primary care doctor, Dr. Efrain Sella within two  weeks for a blood pressure check and monitoring.  She is to maintain minimal activity for 24-48 hours and then she can resume her normal activities. DD:  04/19/00 TD:  04/20/00 Job: [id number]

## 2011-03-17 ENCOUNTER — Other Ambulatory Visit: Payer: Self-pay | Admitting: Internal Medicine

## 2011-06-04 ENCOUNTER — Ambulatory Visit (INDEPENDENT_AMBULATORY_CARE_PROVIDER_SITE_OTHER): Payer: Medicare PPO | Admitting: Internal Medicine

## 2011-06-04 ENCOUNTER — Encounter: Payer: Self-pay | Admitting: Internal Medicine

## 2011-06-04 VITALS — BP 122/76 | HR 88 | Temp 97.9°F | Resp 16 | Wt 165.0 lb

## 2011-06-04 DIAGNOSIS — R945 Abnormal results of liver function studies: Secondary | ICD-10-CM

## 2011-06-04 DIAGNOSIS — M25552 Pain in left hip: Secondary | ICD-10-CM

## 2011-06-04 DIAGNOSIS — E039 Hypothyroidism, unspecified: Secondary | ICD-10-CM

## 2011-06-04 DIAGNOSIS — K7689 Other specified diseases of liver: Secondary | ICD-10-CM

## 2011-06-04 DIAGNOSIS — M25559 Pain in unspecified hip: Secondary | ICD-10-CM

## 2011-06-04 DIAGNOSIS — I10 Essential (primary) hypertension: Secondary | ICD-10-CM

## 2011-06-04 MED ORDER — MELOXICAM 15 MG PO TABS: 15.0000 mg | ORAL_TABLET | Freq: Every day | ORAL | Status: DC | PRN

## 2011-06-04 NOTE — Progress Notes (Signed)
  Subjective:    Patient ID: [name] [name], female    DOB: 01-Jan-1933, 77 y.o.   MRN: [id number]  HPI  The patient presents for a follow-up of  chronic hypertension, chronic LFT elevation C/o L hip pain x 2 mo worse w/sitting   Review of Systems  Constitutional: Negative for chills, activity change, appetite change, fatigue and unexpected weight change.  HENT: Negative for congestion, mouth sores and sinus pressure.   Eyes: Negative for visual disturbance.  Respiratory: Negative for cough and chest tightness.   Gastrointestinal: Negative for nausea and abdominal pain.  Genitourinary: Negative for frequency, difficulty urinating and vaginal pain.  Musculoskeletal: Negative for back pain and gait problem.  Skin: Negative for pallor and rash.  Neurological: Negative for dizziness, tremors, weakness, numbness and headaches.  Psychiatric/Behavioral: Negative for confusion and sleep disturbance.       Objective:   Physical Exam  Constitutional: She appears well-developed and well-nourished. No distress.  HENT:  Head: Normocephalic.  Right Ear: External ear normal.  Left Ear: External ear normal.  Nose: Nose normal.  Mouth/Throat: Oropharynx is clear and moist.  Eyes: Conjunctivae are normal. Pupils are equal, round, and reactive to light. Right eye exhibits no discharge. Left eye exhibits no discharge.  Neck: Normal range of motion. Neck supple. No JVD present. No tracheal deviation present. No thyromegaly present.  Cardiovascular: Normal rate, regular rhythm and normal heart sounds.   Pulmonary/Chest: No stridor. No respiratory distress. She has no wheezes.  Abdominal: Soft. Bowel sounds are normal. She exhibits no distension and no mass. There is no tenderness. There is no rebound and no guarding.  Musculoskeletal: She exhibits tenderness (L hip dist and poster bursitis is present). She exhibits no edema.  Lymphadenopathy:    She has no cervical adenopathy.  Neurological: She  displays normal reflexes. No cranial nerve deficit. She exhibits normal muscle tone. Coordination normal.  Skin: No rash noted. No erythema.  Psychiatric: She has a normal mood and affect. Her behavior is normal. Judgment and thought content normal.     [place] report reviewed     Assessment & Plan:

## 2011-06-04 NOTE — Assessment & Plan Note (Signed)
Continue with current prescription therapy as reflected on the Med list.  

## 2011-06-04 NOTE — Assessment & Plan Note (Signed)
Monitoring LFTs Diet

## 2011-06-04 NOTE — Assessment & Plan Note (Signed)
Start Mobic, stretch

## 2011-06-04 NOTE — Assessment & Plan Note (Signed)
10/12 [place] -- IMPRESSION:  1. Septated cyst or several adjacent simple cysts in the liver are  not significantly changed since June 2010, and are without  vascularity - strongly indicative of benign etiology. Doubt that  additional dedicated imaging follow-up is necessary.  2. Incidental left renal cortical cyst stable since 2011, no  follow up recommended.  3. Otherwise negative abdominal ultrasound with no acute findings.

## 2011-10-01 ENCOUNTER — Other Ambulatory Visit (INDEPENDENT_AMBULATORY_CARE_PROVIDER_SITE_OTHER): Payer: Medicare PPO

## 2011-10-01 ENCOUNTER — Ambulatory Visit (INDEPENDENT_AMBULATORY_CARE_PROVIDER_SITE_OTHER): Payer: Medicare PPO | Admitting: Internal Medicine

## 2011-10-01 ENCOUNTER — Encounter: Payer: Self-pay | Admitting: Internal Medicine

## 2011-10-01 VITALS — BP 140/90 | HR 76 | Temp 98.1°F | Resp 16 | Wt 164.0 lb

## 2011-10-01 DIAGNOSIS — R945 Abnormal results of liver function studies: Secondary | ICD-10-CM

## 2011-10-01 DIAGNOSIS — I10 Essential (primary) hypertension: Secondary | ICD-10-CM

## 2011-10-01 DIAGNOSIS — M545 Low back pain: Secondary | ICD-10-CM

## 2011-10-01 LAB — BASIC METABOLIC PANEL
BUN: 18 mg/dL (ref 6–23)
Calcium: 9.1 mg/dL (ref 8.4–10.5)
Chloride: 103 mEq/L (ref 96–112)
Creatinine, Ser: 0.9 mg/dL (ref 0.4–1.2)
GFR: 65.37 mL/min (ref >60.00)

## 2011-10-01 LAB — HEPATIC FUNCTION PANEL
Bilirubin, Direct: 0 mg/dL (ref 0.0–0.3)
Total Bilirubin: 0.4 mg/dL (ref 0.3–1.2)

## 2011-10-01 MED ORDER — LEVOTHYROXINE SODIUM 25 MCG PO TABS: 25.0000 ug | ORAL_TABLET | Freq: Every day | ORAL | Status: DC

## 2011-10-01 MED ORDER — AMLODIPINE BESYLATE 5 MG PO TABS: 5.0000 mg | ORAL_TABLET | Freq: Every day | ORAL | Status: DC

## 2011-10-01 MED ORDER — CLOBETASOL PROPIONATE 0.05 % EX OINT: TOPICAL_OINTMENT | Freq: Two times a day (BID) | CUTANEOUS | Status: DC

## 2011-10-01 NOTE — Progress Notes (Signed)
Patient ID: [name] [name], female   DOB: 06/03/1933, 78 y.o.   MRN: [id number]  Subjective:    Patient ID: [name] [name], female    DOB: 04-27-1933, 78 y.o.   MRN: [id number]  HPI  The patient presents for a follow-up of  chronic hypertension, chronic LFT elevation C/o L hip pain x 2 mo worse w/sitting - better C/o LBP, eczema   Review of Systems  Constitutional: Negative for chills, activity change, appetite change, fatigue and unexpected weight change.  HENT: Negative for congestion, mouth sores and sinus pressure.   Eyes: Negative for visual disturbance.  Respiratory: Negative for cough and chest tightness.   Gastrointestinal: Negative for nausea and abdominal pain.  Genitourinary: Negative for frequency, difficulty urinating and vaginal pain.  Musculoskeletal: Negative for back pain and gait problem.  Skin: Negative for pallor and rash.  Neurological: Negative for dizziness, tremors, weakness, numbness and headaches.  Psychiatric/Behavioral: Negative for confusion and sleep disturbance.       Objective:   Physical Exam  Constitutional: She appears well-developed and well-nourished. No distress.  HENT:  Head: Normocephalic.  Right Ear: External ear normal.  Left Ear: External ear normal.  Nose: Nose normal.  Mouth/Throat: Oropharynx is clear and moist.  Eyes: Conjunctivae are normal. Pupils are equal, round, and reactive to light. Right eye exhibits no discharge. Left eye exhibits no discharge.  Neck: Normal range of motion. Neck supple. No JVD present. No tracheal deviation present. No thyromegaly present.  Cardiovascular: Normal rate, regular rhythm and normal heart sounds.   Pulmonary/Chest: No stridor. No respiratory distress. She has no wheezes.  Abdominal: Soft. Bowel sounds are normal. She exhibits no distension and no mass. There is no tenderness. There is no rebound and no guarding.  Musculoskeletal: She exhibits no edema and no tenderness (L hip dist and poster  bursitis is present).  Lymphadenopathy:    She has no cervical adenopathy.  Neurological: She displays normal reflexes. No cranial nerve deficit. She exhibits normal muscle tone. Coordination normal.  Skin: No rash noted. No erythema.  Psychiatric: She has a normal mood and affect. Her behavior is normal. Judgment and thought content normal.     [place] report reviewed     Assessment & Plan:

## 2011-10-01 NOTE — Assessment & Plan Note (Signed)
Watching LFTs 

## 2011-10-01 NOTE — Assessment & Plan Note (Signed)
Continue with current prescription therapy as reflected on the Med list.  

## 2011-10-01 NOTE — Assessment & Plan Note (Signed)
Continue with current prescription therapy as reflected on the Med list - Meloxicam prn

## 2011-11-11 ENCOUNTER — Encounter: Payer: Self-pay | Admitting: Internal Medicine

## 2011-11-11 ENCOUNTER — Ambulatory Visit (INDEPENDENT_AMBULATORY_CARE_PROVIDER_SITE_OTHER): Payer: Medicare PPO | Admitting: Internal Medicine

## 2011-11-11 ENCOUNTER — Other Ambulatory Visit (INDEPENDENT_AMBULATORY_CARE_PROVIDER_SITE_OTHER): Payer: Medicare PPO

## 2011-11-11 VITALS — BP 150/102 | HR 84 | Temp 97.1°F | Resp 16 | Wt 167.0 lb

## 2011-11-11 DIAGNOSIS — M19039 Primary osteoarthritis, unspecified wrist: Secondary | ICD-10-CM | POA: Insufficient documentation

## 2011-11-11 DIAGNOSIS — I10 Essential (primary) hypertension: Secondary | ICD-10-CM

## 2011-11-11 DIAGNOSIS — K7689 Other specified diseases of liver: Secondary | ICD-10-CM

## 2011-11-11 LAB — SEDIMENTATION RATE: Sed Rate: 31 mm/hr — ABNORMAL HIGH (ref 0–22)

## 2011-11-11 LAB — BASIC METABOLIC PANEL
BUN: 15 mg/dL (ref 6–23)
Chloride: 103 mEq/L (ref 96–112)
GFR: 90.69 mL/min (ref >60.00)
Potassium: 3.5 mEq/L (ref 3.5–5.1)
Sodium: 139 mEq/L (ref 135–145)

## 2011-11-11 LAB — URIC ACID: Uric Acid, Serum: 5.7 mg/dL (ref 2.4–7.0)

## 2011-11-11 MED ORDER — OXYCODONE-ACETAMINOPHEN 5-325 MG PO TABS: 1.0000 | ORAL_TABLET | Freq: Three times a day (TID) | ORAL | Status: AC | PRN

## 2011-11-11 MED ORDER — MELOXICAM 15 MG PO TABS: 15.0000 mg | ORAL_TABLET | Freq: Every day | ORAL | Status: DC | PRN

## 2011-11-11 NOTE — Assessment & Plan Note (Signed)
Possible gout vs other Labs  Meloxicam ACE

## 2011-11-11 NOTE — Assessment & Plan Note (Signed)
Watching LFTs 

## 2011-11-11 NOTE — Progress Notes (Signed)
Patient ID: [name] [name], female   DOB: 01/28/1933, 78 y.o.   MRN: [id number] Patient ID: [name] [name], female   DOB: [birth year], 78 y.o.   MRN: [id number]  Subjective:    Patient ID: [name] [name], female    DOB: 08/11/1933, 78 y.o.   MRN: [id number]  Hand Pain  Pertinent negatives include no numbness.    C/o R wrist severe pain and sweel C/o L hip pain x 2 mo worse w/sitting - better C/o LBP, eczema better  BP Readings from Last 3 Encounters:  11/11/11 150/102  10/01/11 140/90  06/04/11 122/76   Wt Readings from Last 3 Encounters:  11/11/11 167 lb (75.751 kg)  10/01/11 164 lb (74.39 kg)  06/04/11 165 lb (74.844 kg)      Review of Systems  Constitutional: Negative for chills, activity change, appetite change, fatigue and unexpected weight change.  HENT: Negative for congestion, mouth sores and sinus pressure.   Eyes: Negative for visual disturbance.  Respiratory: Negative for cough and chest tightness.   Gastrointestinal: Negative for nausea and abdominal pain.  Genitourinary: Negative for frequency, difficulty urinating and vaginal pain.  Musculoskeletal: Positive for arthralgias. Negative for back pain and gait problem.  Skin: Negative for pallor and rash.  Neurological: Negative for dizziness, tremors, weakness, numbness and headaches.  Psychiatric/Behavioral: Negative for confusion and sleep disturbance.       Objective:   Physical Exam  Constitutional: She appears well-developed and well-nourished. No distress.  HENT:  Head: Normocephalic.  Right Ear: External ear normal.  Left Ear: External ear normal.  Nose: Nose normal.  Mouth/Throat: Oropharynx is clear and moist.  Eyes: Conjunctivae are normal. Pupils are equal, round, and reactive to light. Right eye exhibits no discharge. Left eye exhibits no discharge.  Neck: Normal range of motion. Neck supple. No JVD present. No tracheal deviation present. No thyromegaly present.  Cardiovascular: Normal rate,  regular rhythm and normal heart sounds.   Pulmonary/Chest: No stridor. No respiratory distress. She has no wheezes.  Abdominal: Soft. Bowel sounds are normal. She exhibits no distension and no mass. There is no tenderness. There is no rebound and no guarding.  Musculoskeletal: She exhibits tenderness. She exhibits no edema.       R wrist and R 1st MCP is swollen and tender  Lymphadenopathy:    She has no cervical adenopathy.  Neurological: She displays normal reflexes. No cranial nerve deficit. She exhibits normal muscle tone. Coordination normal.  Skin: No rash noted. No erythema.  Psychiatric: She has a normal mood and affect. Her behavior is normal. Judgment and thought content normal.    Lab Results  Component Value Date   WBC 3.8* 12/04/2010   HGB 12.2 12/04/2010   HCT 35.6* 12/04/2010   PLT 97.0* 12/04/2010   GLUCOSE 89 10/01/2011   CHOL 234* 12/04/2010   TRIG 95.0 12/04/2010   HDL 46.50 12/04/2010   LDLDIRECT 172.4 12/04/2010   ALT 18 10/01/2011   AST 27 10/01/2011   NA 142 10/01/2011   K 3.9 10/01/2011   CL 103 10/01/2011   CREATININE 0.9 10/01/2011   BUN 18 10/01/2011   CO2 30 10/01/2011   TSH 2.37 12/04/2010   INR 1.0 ratio 12/10/2009   HGBA1C 5.7 05/28/2010        Assessment & Plan:

## 2011-11-11 NOTE — Assessment & Plan Note (Signed)
Continue with current prescription therapy as reflected on the Med list. Will watch BP RTC 6 wks

## 2011-11-12 ENCOUNTER — Telehealth: Payer: Self-pay | Admitting: *Deleted

## 2011-11-12 LAB — RHEUMATOID FACTOR: Rhuematoid fact SerPl-aCnc: <10 IU/mL (ref <=14)

## 2011-11-12 NOTE — Telephone Encounter (Signed)
Pt informed of lab results. 

## 2011-12-24 ENCOUNTER — Ambulatory Visit (INDEPENDENT_AMBULATORY_CARE_PROVIDER_SITE_OTHER): Payer: Medicare PPO | Admitting: Internal Medicine

## 2011-12-24 ENCOUNTER — Encounter: Payer: Self-pay | Admitting: Internal Medicine

## 2011-12-24 VITALS — BP 148/100 | HR 84 | Temp 97.2°F | Resp 16 | Wt 166.0 lb

## 2011-12-24 DIAGNOSIS — E785 Hyperlipidemia, unspecified: Secondary | ICD-10-CM

## 2011-12-24 DIAGNOSIS — M545 Low back pain: Secondary | ICD-10-CM

## 2011-12-24 DIAGNOSIS — I10 Essential (primary) hypertension: Secondary | ICD-10-CM

## 2011-12-24 DIAGNOSIS — K7689 Other specified diseases of liver: Secondary | ICD-10-CM

## 2011-12-24 DIAGNOSIS — E039 Hypothyroidism, unspecified: Secondary | ICD-10-CM

## 2011-12-24 DIAGNOSIS — M25552 Pain in left hip: Secondary | ICD-10-CM

## 2011-12-24 DIAGNOSIS — M25559 Pain in unspecified hip: Secondary | ICD-10-CM

## 2011-12-24 DIAGNOSIS — M19039 Primary osteoarthritis, unspecified wrist: Secondary | ICD-10-CM

## 2011-12-24 MED ORDER — MELOXICAM 15 MG PO TABS: 15.0000 mg | ORAL_TABLET | Freq: Every day | ORAL | Status: DC | PRN

## 2011-12-24 MED ORDER — METHYLPREDNISOLONE ACETATE 80 MG/ML IJ SUSP: 10.0000 mg | Freq: Once | INTRAMUSCULAR | Status: DC

## 2011-12-24 NOTE — Assessment & Plan Note (Signed)
declined statins Diet

## 2011-12-24 NOTE — Patient Instructions (Signed)
Postprocedure instructions :    A Band-Aid should be left on for 12 hours. Injection therapy is not a cure itself. It is used in conjunction with other modalities. You can use nonsteroidal anti-inflammatories like ibuprofen , hot and cold compresses. Rest is recommended in the next 24 hours. You need to report immediately  if fever, chills or any signs of infection develop. 

## 2011-12-24 NOTE — Assessment & Plan Note (Signed)
Continue with current prescription therapy as reflected on the Med list.  

## 2011-12-24 NOTE — Progress Notes (Signed)
Patient ID: [name] [name], female   DOB: 04-Sep-1932, 78 y.o.   MRN: [id number] Patient ID: [name] [name], female   DOB: [birth year], 78 y.o.   MRN: [id number] Patient ID: [name] [name], female   DOB: January 31, 1933, 78 y.o.   MRN: [id number]  Subjective:    Patient ID: [name] [name], female    DOB: 03-06-1933, 78 y.o.   MRN: [id number]  Hand Pain  Pertinent negatives include no numbness.   The patient presents for a follow-up of  chronic hypertension, chronic dyslipidemia    C/o R thumb severe pain w/o swelling C/o L hip pain x 2 mo worse w/sitting  C/o LBP, eczema better  BP Readings from Last 3 Encounters:  12/24/11 148/100  11/11/11 150/102  10/01/11 140/90   Wt Readings from Last 3 Encounters:  12/24/11 166 lb (75.297 kg)  11/11/11 167 lb (75.751 kg)  10/01/11 164 lb (74.39 kg)      Review of Systems  Constitutional: Negative for chills, activity change, appetite change, fatigue and unexpected weight change.  HENT: Negative for congestion, mouth sores and sinus pressure.   Eyes: Negative for visual disturbance.  Respiratory: Negative for cough and chest tightness.   Gastrointestinal: Negative for nausea and abdominal pain.  Genitourinary: Negative for frequency, difficulty urinating and vaginal pain.  Musculoskeletal: Positive for arthralgias. Negative for back pain and gait problem.  Skin: Negative for pallor and rash.  Neurological: Negative for dizziness, tremors, weakness, numbness and headaches.  Psychiatric/Behavioral: Negative for confusion and sleep disturbance.       Objective:   Physical Exam  Constitutional: She appears well-developed and well-nourished. No distress.  HENT:  Head: Normocephalic.  Right Ear: External ear normal.  Left Ear: External ear normal.  Nose: Nose normal.  Mouth/Throat: Oropharynx is clear and moist.  Eyes: Conjunctivae are normal. Pupils are equal, round, and reactive to light. Right eye exhibits no discharge. Left eye exhibits no  discharge.  Neck: Normal range of motion. Neck supple. No JVD present. No tracheal deviation present. No thyromegaly present.  Cardiovascular: Normal rate, regular rhythm and normal heart sounds.   Pulmonary/Chest: No stridor. No respiratory distress. She has no wheezes.  Abdominal: Soft. Bowel sounds are normal. She exhibits no distension and no mass. There is no tenderness. There is no rebound and no guarding.  Musculoskeletal: She exhibits tenderness. She exhibits no edema.       R wrist and R 1st MCP is swollen and tender  Lymphadenopathy:    She has no cervical adenopathy.  Neurological: She displays normal reflexes. No cranial nerve deficit. She exhibits normal muscle tone. Coordination normal.  Skin: No rash noted. No erythema.  Psychiatric: She has a normal mood and affect. Her behavior is normal. Judgment and thought content normal.    Lab Results  Component Value Date   WBC 3.8* 12/04/2010   HGB 12.2 12/04/2010   HCT 35.6* 12/04/2010   PLT 97.0* 12/04/2010   GLUCOSE 87 11/11/2011   CHOL 234* 12/04/2010   TRIG 95.0 12/04/2010   HDL 46.50 12/04/2010   LDLDIRECT 172.4 12/04/2010   ALT 18 10/01/2011   AST 27 10/01/2011   NA 139 11/11/2011   K 3.5 11/11/2011   CL 103 11/11/2011   CREATININE 0.7 11/11/2011   BUN 15 11/11/2011   CO2 28 11/11/2011   TSH 2.37 12/04/2010   INR 1.0 ratio 12/10/2009   HGBA1C 5.7 05/28/2010    Procedure Note :    Procedure :Joint Injection,  R  1st MCP   Indication: refractory  chronic pain.   Risks including unsuccessful procedure , bleeding, infection, bruising, skin atrophy and others were explained to the patient in detail as well as the benefits. Informed consent was obtained and signed.   Tthe patient was placed in a comfortable position. Skin was prepped with Betadine and alcohol  and anesthetized with a cooling spray. Then, a 3 cc syringe with a 0.5 inch long 27-gauge needle was used for a bursa injection in a fan-like fasion with 1 mL of 2% lidocaine and 10 mg  of Depo-Medrol .  Band-Aid was applied. ACE wrap   Tolerated well. Complications: None. Good pain relief following the procedure.   Postprocedure instructions :    A Band-Aid should be left on for 12 hours. Injection therapy is not a cure itself. It is used in conjunction with other modalities. You can use nonsteroidal anti-inflammatories like ibuprofen , hot and cold compresses. Rest is recommended in the next 24 hours. You need to report immediately  if fever, chills or any signs of infection develop.      Assessment & Plan:

## 2011-12-24 NOTE — Assessment & Plan Note (Signed)
Watching LFTs 

## 2011-12-24 NOTE — Assessment & Plan Note (Signed)
4/13 R wrist Abductor poll longus tendonitis  We can inject

## 2011-12-24 NOTE — Assessment & Plan Note (Addendum)
IT band pain, troch bursitis May need to inject next time - June

## 2012-01-06 ENCOUNTER — Encounter: Payer: Self-pay | Admitting: Internal Medicine

## 2012-02-02 ENCOUNTER — Ambulatory Visit (INDEPENDENT_AMBULATORY_CARE_PROVIDER_SITE_OTHER): Payer: Medicare PPO | Admitting: Internal Medicine

## 2012-02-02 ENCOUNTER — Other Ambulatory Visit (INDEPENDENT_AMBULATORY_CARE_PROVIDER_SITE_OTHER): Payer: Medicare PPO

## 2012-02-02 ENCOUNTER — Encounter: Payer: Self-pay | Admitting: Internal Medicine

## 2012-02-02 VITALS — BP 140/90 | HR 80 | Temp 97.7°F | Resp 16 | Wt 164.0 lb

## 2012-02-02 DIAGNOSIS — R945 Abnormal results of liver function studies: Secondary | ICD-10-CM

## 2012-02-02 DIAGNOSIS — M25559 Pain in unspecified hip: Secondary | ICD-10-CM

## 2012-02-02 DIAGNOSIS — M25552 Pain in left hip: Secondary | ICD-10-CM

## 2012-02-02 DIAGNOSIS — I10 Essential (primary) hypertension: Secondary | ICD-10-CM

## 2012-02-02 DIAGNOSIS — E039 Hypothyroidism, unspecified: Secondary | ICD-10-CM

## 2012-02-02 DIAGNOSIS — M19039 Primary osteoarthritis, unspecified wrist: Secondary | ICD-10-CM

## 2012-02-02 LAB — BASIC METABOLIC PANEL
CO2: 27 mEq/L (ref 19–32)
Calcium: 9.2 mg/dL (ref 8.4–10.5)
Glucose, Bld: 92 mg/dL (ref 70–99)
Potassium: 3.9 mEq/L (ref 3.5–5.1)
Sodium: 141 mEq/L (ref 135–145)

## 2012-02-02 NOTE — Assessment & Plan Note (Signed)
Not better. Declined inj today Continue with current prescription NSAID therapy as reflected on the Med list w/caution.

## 2012-02-02 NOTE — Progress Notes (Signed)
   Subjective:    Patient ID: [name] [name], female    DOB: Jan 02, 1933, 78 y.o.   MRN: [id number]  HPI The patient presents for a follow-up of  chronic hypertension, chronic dyslipidemia    F/u on R thumb pain - better C/o L hip pain x 2 mo worse w/sitting - some better f/u LBP  BP Readings from Last 3 Encounters:  02/02/12 140/90  12/24/11 148/100  11/11/11 150/102   Wt Readings from Last 3 Encounters:  02/02/12 164 lb (74.39 kg)  12/24/11 166 lb (75.297 kg)  11/11/11 167 lb (75.751 kg)      Review of Systems  Constitutional: Negative for chills, activity change, appetite change, fatigue and unexpected weight change.  HENT: Negative for congestion, mouth sores and sinus pressure.   Eyes: Negative for visual disturbance.  Respiratory: Negative for cough and chest tightness.   Gastrointestinal: Negative for nausea and abdominal pain.  Genitourinary: Negative for frequency, difficulty urinating and vaginal pain.  Musculoskeletal: Positive for arthralgias. Negative for back pain and gait problem.  Skin: Negative for pallor and rash.  Neurological: Negative for dizziness, tremors, weakness and headaches.  Psychiatric/Behavioral: Negative for confusion and disturbed wake/sleep cycle.       Objective:   Physical Exam  Constitutional: She appears well-developed and well-nourished. No distress.  HENT:  Head: Normocephalic.  Right Ear: External ear normal.  Left Ear: External ear normal.  Nose: Nose normal.  Mouth/Throat: Oropharynx is clear and moist.  Eyes: Conjunctivae are normal. Pupils are equal, round, and reactive to light. Right eye exhibits no discharge. Left eye exhibits no discharge.  Neck: Normal range of motion. Neck supple. No JVD present. No tracheal deviation present. No thyromegaly present.  Cardiovascular: Normal rate, regular rhythm and normal heart sounds.   Pulmonary/Chest: No stridor. No respiratory distress. She has no wheezes.  Abdominal: Soft. Bowel  sounds are normal. She exhibits no distension and no mass. There is no tenderness. There is no rebound and no guarding.  Musculoskeletal: She exhibits tenderness. She exhibits no edema.       L hip is tender  Lymphadenopathy:    She has no cervical adenopathy.  Neurological: She displays normal reflexes. No cranial nerve deficit. She exhibits normal muscle tone. Coordination normal.  Skin: No rash noted. No erythema.  Psychiatric: She has a normal mood and affect. Her behavior is normal. Judgment and thought content normal.    Lab Results  Component Value Date   WBC 3.8* 12/04/2010   HGB 12.2 12/04/2010   HCT 35.6* 12/04/2010   PLT 97.0* 12/04/2010   GLUCOSE 87 11/11/2011   CHOL 234* 12/04/2010   TRIG 95.0 12/04/2010   HDL 46.50 12/04/2010   LDLDIRECT 172.4 12/04/2010   ALT 18 10/01/2011   AST 27 10/01/2011   NA 139 11/11/2011   K 3.5 11/11/2011   CL 103 11/11/2011   CREATININE 0.7 11/11/2011   BUN 15 11/11/2011   CO2 28 11/11/2011   TSH 2.37 12/04/2010   INR 1.0 ratio 12/10/2009   HGBA1C 5.7 05/28/2010        Assessment & Plan:

## 2012-02-02 NOTE — Assessment & Plan Note (Signed)
Continue with current prescription therapy as reflected on the Med list.  

## 2012-02-02 NOTE — Assessment & Plan Note (Signed)
Continue with current prescription therapy as reflected on the Med list. BMET

## 2012-02-02 NOTE — Assessment & Plan Note (Signed)
Better post-injection 

## 2012-02-02 NOTE — Assessment & Plan Note (Signed)
Watching  

## 2012-02-20 ENCOUNTER — Telehealth: Payer: Self-pay | Admitting: Internal Medicine

## 2012-02-20 NOTE — Telephone Encounter (Signed)
Pt informed/transferred to scheduler.  

## 2012-02-20 NOTE — Telephone Encounter (Signed)
Caller: [name] [name]/Patient; PCP: Plotnikov, Alex; CB#: [phone]; ; ; Call regarding Hypertension;  [name] [name] was in the office on 02/02/12 for a check-up.  BP meds were not changed.  BP has been higher than normal since her office visit.  BP 140's/90's.  Wants to know if BP meds should be adjusted.  Utilized Hypertension Guideline.  See PCP within 72 hrs disposition, appt has not been scheduled.  Note sent to office.  Please f/u with [name] [name] regarding BP meds concern.

## 2012-02-20 NOTE — Telephone Encounter (Signed)
Monitor for another 2-3 wks, then OV Thx

## 2012-03-12 ENCOUNTER — Ambulatory Visit (INDEPENDENT_AMBULATORY_CARE_PROVIDER_SITE_OTHER): Payer: Medicare PPO | Admitting: Internal Medicine

## 2012-03-12 ENCOUNTER — Telehealth: Payer: Self-pay | Admitting: Internal Medicine

## 2012-03-12 ENCOUNTER — Encounter: Payer: Self-pay | Admitting: Internal Medicine

## 2012-03-12 ENCOUNTER — Other Ambulatory Visit (INDEPENDENT_AMBULATORY_CARE_PROVIDER_SITE_OTHER): Payer: Medicare PPO

## 2012-03-12 ENCOUNTER — Ambulatory Visit (INDEPENDENT_AMBULATORY_CARE_PROVIDER_SITE_OTHER)
Admission: RE | Admit: 2012-03-12 | Discharge: 2012-03-12 | Disposition: A | Discharge status: Home / Self Care | Payer: Medicare PPO | Source: Ambulatory Visit | Attending: Internal Medicine | Admitting: Internal Medicine

## 2012-03-12 VITALS — BP 160/98 | HR 80 | Temp 97.8°F | Resp 16 | Wt 166.0 lb

## 2012-03-12 DIAGNOSIS — M545 Low back pain, unspecified: Secondary | ICD-10-CM

## 2012-03-12 DIAGNOSIS — I10 Essential (primary) hypertension: Secondary | ICD-10-CM

## 2012-03-12 DIAGNOSIS — M19039 Primary osteoarthritis, unspecified wrist: Secondary | ICD-10-CM

## 2012-03-12 DIAGNOSIS — M7989 Other specified soft tissue disorders: Secondary | ICD-10-CM

## 2012-03-12 DIAGNOSIS — R945 Abnormal results of liver function studies: Secondary | ICD-10-CM

## 2012-03-12 LAB — BASIC METABOLIC PANEL
BUN: 14 mg/dL (ref 6–23)
CO2: 31 mEq/L (ref 19–32)
Chloride: 102 mEq/L (ref 96–112)
Potassium: 4.3 mEq/L (ref 3.5–5.1)

## 2012-03-12 LAB — HEPATIC FUNCTION PANEL
ALT: 32 U/L (ref 0–35)
AST: 40 U/L — ABNORMAL HIGH (ref 0–37)
Bilirubin, Direct: 0.1 mg/dL (ref 0.0–0.3)
Total Bilirubin: 0.6 mg/dL (ref 0.3–1.2)
Total Protein: 8.1 g/dL (ref 6.0–8.3)

## 2012-03-12 LAB — SEDIMENTATION RATE: Sed Rate: 30 mm/hr — ABNORMAL HIGH (ref 0–22)

## 2012-03-12 MED ORDER — LOSARTAN POTASSIUM 100 MG PO TABS: 100.0000 mg | ORAL_TABLET | Freq: Every day | ORAL | Status: DC

## 2012-03-12 MED ORDER — METHYLPREDNISOLONE ACETATE 80 MG/ML IJ SUSP
120.0000 mg | Freq: Once | INTRAMUSCULAR | Status: AC
  Administered 2012-03-12: 120 mg via INTRAMUSCULAR

## 2012-03-12 NOTE — Assessment & Plan Note (Signed)
Chronic. Now she has elevated BP at home Added Losartan

## 2012-03-12 NOTE — Assessment & Plan Note (Signed)
Watching LFTs 

## 2012-03-12 NOTE — Assessment & Plan Note (Addendum)
A little better Depo 120 mg IM

## 2012-03-12 NOTE — Progress Notes (Signed)
Subjective:    Patient ID: [name] [name], female    DOB: 02-26-1933, 78 y.o.   MRN: [id number]  Arthritis Presents for initial visit. The disease course has been worsening. She complains of pain, joint swelling and joint warmth. (L 4th toe, hands) Affected locations include the left wrist, right toes and right wrist. Her pain is at a severity of 5/10. Pertinent negatives include no dry mouth, fatigue, rash or weight loss. There is no history of lupus, psoriasis or rheumatoid arthritis.  Her pertinent risk factors include overuse. (Gout) Past treatments include acetaminophen and NSAIDs (keflex). The treatment provided mild relief. Compliance with prior treatments has been good.   The patient presents for a follow-up of  chronic hypertension, chronic dyslipidemia    F/u on R thumb pain - better C/o L hip pain x 2 mo worse w/sitting - some better f/u LBP  BP Readings from Last 3 Encounters:  03/12/12 160/98  02/02/12 140/90  12/24/11 148/100   Wt Readings from Last 3 Encounters:  03/12/12 166 lb (75.297 kg)  02/02/12 164 lb (74.39 kg)  12/24/11 166 lb (75.297 kg)      Review of Systems  Constitutional: Negative for chills, weight loss, activity change, appetite change, fatigue and unexpected weight change.  HENT: Negative for congestion, mouth sores and sinus pressure.   Eyes: Negative for visual disturbance.  Respiratory: Negative for cough and chest tightness.   Gastrointestinal: Negative for nausea and abdominal pain.  Genitourinary: Negative for frequency, difficulty urinating and vaginal pain.  Musculoskeletal: Positive for joint swelling, arthralgias and arthritis. Negative for back pain and gait problem.  Skin: Negative for pallor and rash.  Neurological: Negative for dizziness, tremors and weakness.  Psychiatric/Behavioral: Negative for confusion and disturbed wake/sleep cycle.       Objective:   Physical Exam  Constitutional: She appears well-developed and  well-nourished. No distress.  HENT:  Head: Normocephalic.  Right Ear: External ear normal.  Left Ear: External ear normal.  Nose: Nose normal.  Mouth/Throat: Oropharynx is clear and moist.  Eyes: Conjunctivae are normal. Pupils are equal, round, and reactive to light. Right eye exhibits no discharge. Left eye exhibits no discharge.  Neck: Normal range of motion. Neck supple. No JVD present. No tracheal deviation present. No thyromegaly present.  Cardiovascular: Normal rate, regular rhythm and normal heart sounds.   Pulmonary/Chest: No stridor. No respiratory distress. She has no wheezes.  Abdominal: Soft. Bowel sounds are normal. She exhibits no distension and no mass. There is no tenderness. There is no rebound and no guarding.  Musculoskeletal: She exhibits tenderness. She exhibits no edema.       L hip is tender  Lymphadenopathy:    She has no cervical adenopathy.  Neurological: She displays normal reflexes. No cranial nerve deficit. She exhibits normal muscle tone. Coordination normal.  Skin: No rash noted. No erythema.  Psychiatric: She has a normal mood and affect. Her behavior is normal. Judgment and thought content normal.    Lab Results  Component Value Date   WBC 3.8* 12/04/2010   HGB 12.2 12/04/2010   HCT 35.6* 12/04/2010   PLT 97.0* 12/04/2010   GLUCOSE 92 02/02/2012   CHOL 234* 12/04/2010   TRIG 95.0 12/04/2010   HDL 46.50 12/04/2010   LDLDIRECT 172.4 12/04/2010   ALT 18 10/01/2011   AST 27 10/01/2011   NA 141 02/02/2012   K 3.9 02/02/2012   CL 105 02/02/2012   CREATININE 0.9 02/02/2012   BUN 14 02/02/2012  CO2 27 02/02/2012   TSH 2.37 12/04/2010   INR 1.0 ratio 12/10/2009   HGBA1C 5.7 05/28/2010        Assessment & Plan:

## 2012-03-12 NOTE — Assessment & Plan Note (Signed)
7/13 L 4th toe - r/o gout, RA Labs X ray

## 2012-03-12 NOTE — Telephone Encounter (Signed)
[name] [name], please, inform patient that the xray was ok - no joint or bone problems Thx

## 2012-03-12 NOTE — Assessment & Plan Note (Signed)
Continue with current prescription therapy as reflected on the Med list.  

## 2012-03-15 NOTE — Telephone Encounter (Signed)
Pt informed

## 2012-04-14 ENCOUNTER — Ambulatory Visit (INDEPENDENT_AMBULATORY_CARE_PROVIDER_SITE_OTHER): Payer: Medicare PPO | Admitting: Internal Medicine

## 2012-04-14 ENCOUNTER — Encounter: Payer: Self-pay | Admitting: Internal Medicine

## 2012-04-14 VITALS — BP 140/82 | HR 72 | Temp 97.7°F | Resp 16 | Wt 161.0 lb

## 2012-04-14 DIAGNOSIS — N3289 Other specified disorders of bladder: Secondary | ICD-10-CM

## 2012-04-14 DIAGNOSIS — E039 Hypothyroidism, unspecified: Secondary | ICD-10-CM

## 2012-04-14 DIAGNOSIS — E785 Hyperlipidemia, unspecified: Secondary | ICD-10-CM

## 2012-04-14 DIAGNOSIS — I10 Essential (primary) hypertension: Secondary | ICD-10-CM

## 2012-04-14 DIAGNOSIS — M545 Low back pain, unspecified: Secondary | ICD-10-CM

## 2012-04-14 DIAGNOSIS — F411 Generalized anxiety disorder: Secondary | ICD-10-CM

## 2012-04-14 DIAGNOSIS — R21 Rash and other nonspecific skin eruption: Secondary | ICD-10-CM | POA: Insufficient documentation

## 2012-04-14 MED ORDER — FESOTERODINE FUMARATE ER 8 MG PO TB24: 8.0000 mg | ORAL_TABLET | Freq: Every day | ORAL | Status: DC

## 2012-04-14 MED ORDER — CLOBETASOL PROPIONATE 0.05 % EX OINT: TOPICAL_OINTMENT | Freq: Two times a day (BID) | CUTANEOUS | Status: DC

## 2012-04-14 MED ORDER — LORAZEPAM 1 MG PO TABS: 1.0000 mg | ORAL_TABLET | Freq: Two times a day (BID) | ORAL | Status: DC | PRN

## 2012-04-14 NOTE — Assessment & Plan Note (Signed)
Continue with current prescription therapy as reflected on the Med list.  

## 2012-04-14 NOTE — Assessment & Plan Note (Signed)
Better  

## 2012-04-14 NOTE — Assessment & Plan Note (Signed)
Toviaz prn to try

## 2012-04-14 NOTE — Assessment & Plan Note (Signed)
She declined statins  

## 2012-04-14 NOTE — Progress Notes (Signed)
Subjective:    Patient ID: [name] [name], female    DOB: Apr 04, 1933, 78 y.o.   MRN: [id number]  Arthritis Presents for follow-up visit. The disease course has been improving. She reports no pain, joint swelling or joint warmth. (L 4th toe, hands) Affected locations include the left wrist, right toes and right wrist. Her pain is at a severity of 5/10. Pertinent negatives include no dry mouth, fatigue, rash or weight loss. There is no history of lupus, psoriasis or rheumatoid arthritis.  Her pertinent risk factors include overuse. (Gout) Past treatments include acetaminophen and NSAIDs (keflex). The treatment provided mild relief. Compliance with prior treatments has been good.   The patient presents for a follow-up of  chronic hypertension, chronic dyslipidemia    F/u on R thumb pain - better C/o L hip pain x 2 mo worse w/sitting - better C/o bladder frequency - chronic x years f/u LBP resolved  BP Readings from Last 3 Encounters:  04/14/12 140/82  03/12/12 160/98  02/02/12 140/90   Wt Readings from Last 3 Encounters:  04/14/12 161 lb (73.029 kg)  03/12/12 166 lb (75.297 kg)  02/02/12 164 lb (74.39 kg)      Review of Systems  Constitutional: Negative for chills, weight loss, activity change, appetite change, fatigue and unexpected weight change.  HENT: Negative for congestion, mouth sores and sinus pressure.   Eyes: Negative for visual disturbance.  Respiratory: Negative for cough and chest tightness.   Gastrointestinal: Negative for nausea and abdominal pain.  Genitourinary: Negative for frequency, difficulty urinating and vaginal pain.  Musculoskeletal: Positive for arthralgias (better) and arthritis. Negative for back pain, joint swelling and gait problem.  Skin: Negative for pallor and rash.  Neurological: Negative for dizziness, tremors and weakness.  Psychiatric/Behavioral: Negative for confusion and disturbed wake/sleep cycle.       Objective:   Physical Exam    Constitutional: She appears well-developed and well-nourished. No distress.  HENT:  Head: Normocephalic.  Right Ear: External ear normal.  Left Ear: External ear normal.  Nose: Nose normal.  Mouth/Throat: Oropharynx is clear and moist.  Eyes: Conjunctivae are normal. Pupils are equal, round, and reactive to light. Right eye exhibits no discharge. Left eye exhibits no discharge.  Neck: Normal range of motion. Neck supple. No JVD present. No tracheal deviation present. No thyromegaly present.  Cardiovascular: Normal rate, regular rhythm and normal heart sounds.   Pulmonary/Chest: No stridor. No respiratory distress. She has no wheezes.  Abdominal: Soft. Bowel sounds are normal. She exhibits no distension and no mass. There is no tenderness. There is no rebound and no guarding.  Musculoskeletal: She exhibits no edema and no tenderness.       L hip is not tender  Lymphadenopathy:    She has no cervical adenopathy.  Neurological: She displays normal reflexes. No cranial nerve deficit. She exhibits normal muscle tone. Coordination normal.  Skin: No rash noted. No erythema.  Psychiatric: She has a normal mood and affect. Her behavior is normal. Judgment and thought content normal.    Lab Results  Component Value Date   WBC 3.8* 12/04/2010   HGB 12.2 12/04/2010   HCT 35.6* 12/04/2010   PLT 97.0* 12/04/2010   GLUCOSE 93 03/12/2012   CHOL 234* 12/04/2010   TRIG 95.0 12/04/2010   HDL 46.50 12/04/2010   LDLDIRECT 172.4 12/04/2010   ALT 32 03/12/2012   AST 40* 03/12/2012   NA 140 03/12/2012   K 4.3 03/12/2012   CL 102 03/12/2012  CREATININE 0.8 03/12/2012   BUN 14 03/12/2012   CO2 31 03/12/2012   TSH 2.37 12/04/2010   INR 1.0 ratio 12/10/2009   HGBA1C 5.7 05/28/2010        Assessment & Plan:

## 2012-04-29 ENCOUNTER — Encounter: Payer: Self-pay | Admitting: Gastroenterology

## 2012-05-10 ENCOUNTER — Other Ambulatory Visit: Payer: Self-pay

## 2012-05-10 MED ORDER — FESOTERODINE FUMARATE ER 8 MG PO TB24: 8.0000 mg | ORAL_TABLET | Freq: Every day | ORAL | Status: DC

## 2012-05-24 ENCOUNTER — Ambulatory Visit: Payer: Medicare PPO | Admitting: Gastroenterology

## 2012-05-24 ENCOUNTER — Encounter: Payer: Self-pay | Admitting: Gastroenterology

## 2012-05-24 ENCOUNTER — Encounter: Payer: Medicare PPO | Admitting: Gastroenterology

## 2012-05-24 ENCOUNTER — Ambulatory Visit (INDEPENDENT_AMBULATORY_CARE_PROVIDER_SITE_OTHER): Payer: Medicare PPO | Admitting: Gastroenterology

## 2012-05-24 DIAGNOSIS — Z23 Encounter for immunization: Secondary | ICD-10-CM

## 2012-05-24 NOTE — Patient Instructions (Signed)
Error

## 2012-05-24 NOTE — Progress Notes (Signed)
This encounter was created in error - please disregard.

## 2012-05-24 NOTE — Progress Notes (Deleted)
History of Present Illness: This is a 78 year old female with a history of adenomatous colon polyps diagnosed in 2000. She returns to discuss surveillance colonoscopy.  Review of Systems: Pertinent positive and negative review of systems were noted in the above HPI section. All other review of systems were otherwise negative.  Current Medications, Allergies, Past Medical History, Past Surgical History, Family History and Social History were reviewed in [employer] record.  Physical Exam: General: Well developed , well nourished, no acute distress Head: Normocephalic and atraumatic Eyes:  sclerae anicteric, EOMI Ears: Normal auditory acuity Mouth: No deformity or lesions Neck: Supple, no masses or thyromegaly Lungs: Clear throughout to auscultation Heart: Regular rate and rhythm; no murmurs, rubs or bruits Abdomen: Soft, non tender and non distended. No masses, hepatosplenomegaly or hernias noted. Normal Bowel sounds Rectal: Musculoskeletal: Symmetrical with no gross deformities  Skin: No lesions on visible extremities Pulses:  Normal pulses noted Extremities: No clubbing, cyanosis, edema or deformities noted Neurological: Alert oriented x 4, grossly nonfocal Cervical Nodes:  No significant cervical adenopathy Inguinal Nodes: No significant inguinal adenopathy Psychological:  Alert and cooperative. Normal mood and affect  Assessment and Recommendations:  1. Personal history of adenomatous colon polyps, initial diagnosis 2003.

## 2012-06-02 ENCOUNTER — Ambulatory Visit: Payer: Medicare PPO | Admitting: Internal Medicine

## 2012-06-09 ENCOUNTER — Ambulatory Visit (INDEPENDENT_AMBULATORY_CARE_PROVIDER_SITE_OTHER): Payer: Medicare PPO | Admitting: *Deleted

## 2012-06-09 DIAGNOSIS — Z2911 Encounter for prophylactic immunotherapy for respiratory syncytial virus (RSV): Secondary | ICD-10-CM

## 2012-06-09 DIAGNOSIS — Z23 Encounter for immunization: Secondary | ICD-10-CM

## 2012-07-29 ENCOUNTER — Other Ambulatory Visit: Payer: Self-pay | Admitting: *Deleted

## 2012-07-29 MED ORDER — FESOTERODINE FUMARATE ER 8 MG PO TB24: 8.0000 mg | ORAL_TABLET | Freq: Every day | ORAL | Status: DC

## 2012-07-29 NOTE — Telephone Encounter (Signed)
Pt called requesting refill of Toviaz 8mg  rx. Left message for pt callback office for which pharmacy she would like rx sent to.

## 2012-07-29 NOTE — Telephone Encounter (Signed)
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

## 2012-08-17 ENCOUNTER — Ambulatory Visit: Payer: Medicare PPO | Admitting: Internal Medicine

## 2012-08-23 ENCOUNTER — Ambulatory Visit (INDEPENDENT_AMBULATORY_CARE_PROVIDER_SITE_OTHER): Payer: Medicare Other | Admitting: Internal Medicine

## 2012-08-23 ENCOUNTER — Encounter: Payer: Self-pay | Admitting: Internal Medicine

## 2012-08-23 VITALS — BP 150/100 | HR 92 | Temp 97.7°F | Wt 165.0 lb

## 2012-08-23 DIAGNOSIS — L408 Other psoriasis: Secondary | ICD-10-CM

## 2012-08-23 DIAGNOSIS — L409 Psoriasis, unspecified: Secondary | ICD-10-CM

## 2012-08-23 DIAGNOSIS — M129 Arthropathy, unspecified: Secondary | ICD-10-CM

## 2012-08-23 DIAGNOSIS — R21 Rash and other nonspecific skin eruption: Secondary | ICD-10-CM

## 2012-08-23 DIAGNOSIS — M1990 Unspecified osteoarthritis, unspecified site: Secondary | ICD-10-CM | POA: Insufficient documentation

## 2012-08-23 MED ORDER — IBUPROFEN 600 MG PO TABS: ORAL_TABLET | ORAL | Status: DC

## 2012-08-23 MED ORDER — TRAMADOL HCL 50 MG PO TABS: 50.0000 mg | ORAL_TABLET | Freq: Two times a day (BID) | ORAL | Status: DC | PRN

## 2012-08-23 NOTE — Assessment & Plan Note (Signed)
Chronic  Poss psoriasis related. OA 1/14 rash, nail changes, arthritis Rheum cons

## 2012-08-23 NOTE — Assessment & Plan Note (Signed)
Continue with current prescription therapy as reflected on the Med list.  

## 2012-08-23 NOTE — Assessment & Plan Note (Addendum)
1/14 rash, nail changes, arthritis Rheum cons

## 2012-08-23 NOTE — Progress Notes (Signed)
Subjective:    Arthritis Presents for follow-up visit. The disease course has been improving. She complains of pain. She reports no joint swelling or joint warmth. (L 4th toe, hands) Affected locations include the left wrist, right toes, right wrist and left MCP. Her pain is at a severity of 7/10. Pertinent negatives include no dry mouth, fatigue, rash or weight loss. There is no history of lupus, psoriasis or rheumatoid arthritis.  Her pertinent risk factors include overuse. (Gout) Past treatments include acetaminophen and NSAIDs (keflex). The treatment provided mild relief. Compliance with prior treatments has been good.   The patient presents for a follow-up of  chronic hypertension, chronic dyslipidemia    F/u on R thumb pain - better C/o L hip pain x 2 mo worse w/sitting - better C/o bladder frequency - chronic x years f/u LBP resolved  BP Readings from Last 3 Encounters:  08/23/12 150/100  05/24/12 110/80  04/14/12 140/82   Wt Readings from Last 3 Encounters:  08/23/12 165 lb (74.844 kg)  05/24/12 161 lb 4 oz (73.143 kg)  04/14/12 161 lb (73.029 kg)      Review of Systems  Constitutional: Negative for chills, weight loss, activity change, appetite change, fatigue and unexpected weight change.  HENT: Negative for congestion, mouth sores and sinus pressure.   Eyes: Negative for visual disturbance.  Respiratory: Negative for cough and chest tightness.   Gastrointestinal: Negative for nausea and abdominal pain.  Genitourinary: Negative for frequency, difficulty urinating and vaginal pain.  Musculoskeletal: Positive for arthralgias (better) and arthritis. Negative for back pain, joint swelling and gait problem.  Skin: Negative for pallor and rash.  Neurological: Negative for dizziness, tremors and weakness.  Psychiatric/Behavioral: Negative for confusion and sleep disturbance.       Objective:   Physical Exam  Constitutional: She appears well-developed and  well-nourished. No distress.  HENT:  Head: Normocephalic.  Right Ear: External ear normal.  Left Ear: External ear normal.  Nose: Nose normal.  Mouth/Throat: Oropharynx is clear and moist.  Eyes: Conjunctivae normal are normal. Pupils are equal, round, and reactive to light. Right eye exhibits no discharge. Left eye exhibits no discharge.  Neck: Normal range of motion. Neck supple. No JVD present. No tracheal deviation present. No thyromegaly present.  Cardiovascular: Normal rate, regular rhythm and normal heart sounds.   Pulmonary/Chest: No stridor. No respiratory distress. She has no wheezes.  Abdominal: Soft. Bowel sounds are normal. She exhibits no distension and no mass. There is no tenderness. There is no rebound and no guarding.  Musculoskeletal: She exhibits no edema and no tenderness.       L thumb base is tender  Lymphadenopathy:    She has no cervical adenopathy.  Neurological: She displays normal reflexes. No cranial nerve deficit. She exhibits normal muscle tone. Coordination normal.  Skin: No rash noted. No erythema.       Psoriatic patches  Psychiatric: She has a normal mood and affect. Her behavior is normal. Judgment and thought content normal.    Lab Results  Component Value Date   WBC 3.8* 12/04/2010   HGB 12.2 12/04/2010   HCT 35.6* 12/04/2010   PLT 97.0* 12/04/2010   GLUCOSE 93 03/12/2012   CHOL 234* 12/04/2010   TRIG 95.0 12/04/2010   HDL 46.50 12/04/2010   LDLDIRECT 172.4 12/04/2010   ALT 32 03/12/2012   AST 40* 03/12/2012   NA 140 03/12/2012   K 4.3 03/12/2012   CL 102 03/12/2012   CREATININE 0.8 03/12/2012  BUN 14 03/12/2012   CO2 31 03/12/2012   TSH 2.37 12/04/2010   INR 1.0 ratio 12/10/2009   HGBA1C 5.7 05/28/2010        Assessment & Plan:

## 2012-09-22 ENCOUNTER — Other Ambulatory Visit: Payer: Self-pay | Admitting: *Deleted

## 2012-09-22 MED ORDER — AMLODIPINE BESYLATE 5 MG PO TABS: 5.0000 mg | ORAL_TABLET | Freq: Every day | ORAL | Status: DC

## 2012-09-22 MED ORDER — LEVOTHYROXINE SODIUM 25 MCG PO TABS: 25.0000 ug | ORAL_TABLET | Freq: Every day | ORAL | Status: DC

## 2012-09-22 NOTE — Telephone Encounter (Signed)
PATIENT CALLED REQUEST REFILL ON MEDS TO [facility]. RX SENT TO [facility]. AMLODIPINE AND LEVOTHYROXINE.

## 2012-11-22 ENCOUNTER — Other Ambulatory Visit: Payer: Self-pay | Admitting: Internal Medicine

## 2012-11-22 ENCOUNTER — Ambulatory Visit (INDEPENDENT_AMBULATORY_CARE_PROVIDER_SITE_OTHER): Payer: Medicare Other | Admitting: Internal Medicine

## 2012-11-22 ENCOUNTER — Encounter: Payer: Self-pay | Admitting: Internal Medicine

## 2012-11-22 ENCOUNTER — Other Ambulatory Visit (INDEPENDENT_AMBULATORY_CARE_PROVIDER_SITE_OTHER): Payer: Medicare Other

## 2012-11-22 VITALS — BP 128/88 | HR 76 | Temp 98.2°F | Resp 16 | Wt 163.0 lb

## 2012-11-22 DIAGNOSIS — E785 Hyperlipidemia, unspecified: Secondary | ICD-10-CM

## 2012-11-22 DIAGNOSIS — F329 Major depressive disorder, single episode, unspecified: Secondary | ICD-10-CM

## 2012-11-22 DIAGNOSIS — I10 Essential (primary) hypertension: Secondary | ICD-10-CM

## 2012-11-22 DIAGNOSIS — R21 Rash and other nonspecific skin eruption: Secondary | ICD-10-CM

## 2012-11-22 DIAGNOSIS — L4050 Arthropathic psoriasis, unspecified: Secondary | ICD-10-CM

## 2012-11-22 DIAGNOSIS — E039 Hypothyroidism, unspecified: Secondary | ICD-10-CM

## 2012-11-22 LAB — BASIC METABOLIC PANEL
CO2: 30 mEq/L (ref 19–32)
Chloride: 101 mEq/L (ref 96–112)
Potassium: 3.3 mEq/L — ABNORMAL LOW (ref 3.5–5.1)
Sodium: 140 mEq/L (ref 135–145)

## 2012-11-22 MED ORDER — PREDNISOLONE 5 MG PO TABS: 5.0000 mg | ORAL_TABLET | Freq: Every day | ORAL | Status: DC

## 2012-11-22 MED ORDER — POTASSIUM CHLORIDE ER 10 MEQ PO TBCR: 10.0000 meq | EXTENDED_RELEASE_TABLET | Freq: Two times a day (BID) | ORAL | Status: DC

## 2012-11-22 NOTE — Assessment & Plan Note (Signed)
Lorazepam ° °

## 2012-11-22 NOTE — Progress Notes (Signed)
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  Arthritis Presents for follow-up visit. The disease course has been improving. She reports no pain, joint swelling or joint warmth. (L 4th toe, hands) The symptoms have been improving. Affected locations include the left wrist, right toes, right wrist and left MCP. Her pain is at a severity of 0/10. Pertinent negatives include no dry mouth, fatigue, rash or weight loss. There is no history of lupus, psoriasis or rheumatoid arthritis.  Her pertinent risk factors include overuse. (Gout) Past treatments include acetaminophen and NSAIDs (keflex). The treatment provided mild relief. Compliance with prior treatments has been good.   The patient presents for a follow-up of  chronic hypertension, chronic dyslipidemia    f/u LBP, arthralgias resolved  BP Readings from Last 3 Encounters:  11/22/12 128/88  08/23/12 150/100  05/24/12 110/80   Wt Readings from Last 3 Encounters:  11/22/12 163 lb (73.936 kg)  08/23/12 165 lb (74.844 kg)  05/24/12 161 lb 4 oz (73.143 kg)      Review of Systems  Constitutional: Negative for chills, weight loss, activity change, appetite change, fatigue and unexpected weight change.  HENT: Negative for congestion, mouth sores and sinus pressure.   Eyes: Negative for visual disturbance.  Respiratory: Negative for cough and chest tightness.   Gastrointestinal: Negative for nausea and abdominal pain.  Genitourinary: Negative for frequency, difficulty urinating and vaginal pain.  Musculoskeletal: Positive for arthralgias (better) and arthritis. Negative for back pain, joint swelling and gait problem.  Skin: Negative for pallor and rash.  Neurological: Negative for dizziness, tremors and weakness.  Psychiatric/Behavioral: Negative for confusion and sleep disturbance.       Objective:   Physical Exam  Constitutional: She appears well-developed and well-nourished. No distress.  HENT:   Head: Normocephalic.  Right Ear: External ear normal.  Left Ear: External ear normal.  Nose: Nose normal.  Mouth/Throat: Oropharynx is clear and moist.  Eyes: Conjunctivae are normal. Pupils are equal, round, and reactive to light. Right eye exhibits no discharge. Left eye exhibits no discharge.  Neck: Normal range of motion. Neck supple. No JVD present. No tracheal deviation present. No thyromegaly present.  Cardiovascular: Normal rate, regular rhythm and normal heart sounds.   Pulmonary/Chest: No stridor. No respiratory distress. She has no wheezes.  Abdominal: Soft. Bowel sounds are normal. She exhibits no distension and no mass. There is no tenderness. There is no rebound and no guarding.  Musculoskeletal: She exhibits no edema and no tenderness.  L thumb base is tender  Lymphadenopathy:    She has no cervical adenopathy.  Neurological: She displays normal reflexes. No cranial nerve deficit. She exhibits normal muscle tone. Coordination normal.  Skin: No rash noted. No erythema.  Psoriatic patches  Psychiatric: She has a normal mood and affect. Her behavior is normal. Judgment and thought content normal.    Lab Results  Component Value Date   WBC 3.8* 12/04/2010   HGB 12.2 12/04/2010   HCT 35.6* 12/04/2010   PLT 97.0* 12/04/2010   GLUCOSE 93 03/12/2012   CHOL 234* 12/04/2010   TRIG 95.0 12/04/2010   HDL 46.50 12/04/2010   LDLDIRECT 172.4 12/04/2010   ALT 32 03/12/2012   AST 40* 03/12/2012   NA 140 03/12/2012   K 4.3 03/12/2012   CL 102 03/12/2012   CREATININE 0.8 03/12/2012   BUN 14 03/12/2012   CO2 31 03/12/2012   TSH 2.37 12/04/2010   INR 1.0 ratio 12/10/2009  HGBA1C 5.7 05/28/2010        Assessment & Plan:

## 2012-11-22 NOTE — Assessment & Plan Note (Signed)
Doing great on Predn 5 mg/d BMET

## 2012-11-22 NOTE — Assessment & Plan Note (Signed)
Continue with current prescription therapy as reflected on the Med list.  

## 2012-11-22 NOTE — Assessment & Plan Note (Signed)
Psoriasis

## 2012-12-30 ENCOUNTER — Encounter: Payer: Self-pay | Admitting: Internal Medicine

## 2013-02-21 ENCOUNTER — Ambulatory Visit (INDEPENDENT_AMBULATORY_CARE_PROVIDER_SITE_OTHER): Payer: Medicare Other | Admitting: Internal Medicine

## 2013-02-21 ENCOUNTER — Encounter: Payer: Self-pay | Admitting: Internal Medicine

## 2013-02-21 ENCOUNTER — Other Ambulatory Visit (INDEPENDENT_AMBULATORY_CARE_PROVIDER_SITE_OTHER): Payer: Medicare Other

## 2013-02-21 VITALS — BP 150/90 | HR 68 | Temp 98.2°F | Resp 16 | Wt 166.0 lb

## 2013-02-21 DIAGNOSIS — M1990 Unspecified osteoarthritis, unspecified site: Secondary | ICD-10-CM

## 2013-02-21 DIAGNOSIS — F3289 Other specified depressive episodes: Secondary | ICD-10-CM

## 2013-02-21 DIAGNOSIS — L408 Other psoriasis: Secondary | ICD-10-CM

## 2013-02-21 DIAGNOSIS — L409 Psoriasis, unspecified: Secondary | ICD-10-CM

## 2013-02-21 DIAGNOSIS — M129 Arthropathy, unspecified: Secondary | ICD-10-CM

## 2013-02-21 DIAGNOSIS — E785 Hyperlipidemia, unspecified: Secondary | ICD-10-CM

## 2013-02-21 DIAGNOSIS — E039 Hypothyroidism, unspecified: Secondary | ICD-10-CM

## 2013-02-21 DIAGNOSIS — I10 Essential (primary) hypertension: Secondary | ICD-10-CM

## 2013-02-21 DIAGNOSIS — F329 Major depressive disorder, single episode, unspecified: Secondary | ICD-10-CM

## 2013-02-21 DIAGNOSIS — K7689 Other specified diseases of liver: Secondary | ICD-10-CM

## 2013-02-21 DIAGNOSIS — F411 Generalized anxiety disorder: Secondary | ICD-10-CM

## 2013-02-21 DIAGNOSIS — R945 Abnormal results of liver function studies: Secondary | ICD-10-CM

## 2013-02-21 LAB — HEPATIC FUNCTION PANEL
AST: 25 U/L (ref 0–37)
Albumin: 4.2 g/dL (ref 3.5–5.2)
Alkaline Phosphatase: 51 U/L (ref 39–117)
Bilirubin, Direct: 0.1 mg/dL (ref 0.0–0.3)
Total Protein: 8.1 g/dL (ref 6.0–8.3)

## 2013-02-21 LAB — BASIC METABOLIC PANEL
BUN: 15 mg/dL (ref 6–23)
Calcium: 9.6 mg/dL (ref 8.4–10.5)
Chloride: 104 mEq/L (ref 96–112)
Creatinine, Ser: 1 mg/dL (ref 0.4–1.2)

## 2013-02-21 MED ORDER — CLOBETASOL PROPIONATE 0.05 % EX OINT: TOPICAL_OINTMENT | Freq: Two times a day (BID) | CUTANEOUS | Status: DC

## 2013-02-21 NOTE — Assessment & Plan Note (Signed)
Continue with current prescription therapy as reflected on the Med list.  

## 2013-02-21 NOTE — Assessment & Plan Note (Signed)
  On diet  

## 2013-02-21 NOTE — Assessment & Plan Note (Signed)
Wt Readings from Last 3 Encounters:  02/21/13 166 lb (75.297 kg)  11/22/12 163 lb (73.936 kg)  08/23/12 165 lb (74.844 kg)

## 2013-02-21 NOTE — Progress Notes (Signed)
Patient ID: [name] [name], female   DOB: 12-19-1932, 79 y.o.   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  Arthritis Presents for follow-up visit. The disease course has been improving. She reports no pain, joint swelling or joint warmth. (L 4th toe, hands) The symptoms have been improving. Affected locations include the left wrist, right toes, right wrist and left MCP. Her pain is at a severity of 0/10. Pertinent negatives include no dry mouth, fatigue, rash or weight loss. There is no history of lupus, psoriasis or rheumatoid arthritis.  Her pertinent risk factors include overuse. (Gout) Past treatments include acetaminophen and NSAIDs (keflex). The treatment provided mild relief. Compliance with prior treatments has been good.   The patient presents for a follow-up of  chronic hypertension, chronic dyslipidemia    f/u LBP, arthralgias resolved  BP Readings from Last 3 Encounters:  02/21/13 150/90  11/22/12 128/88  08/23/12 150/100   Wt Readings from Last 3 Encounters:  02/21/13 166 lb (75.297 kg)  11/22/12 163 lb (73.936 kg)  08/23/12 165 lb (74.844 kg)      Review of Systems  Constitutional: Negative for chills, weight loss, activity change, appetite change, fatigue and unexpected weight change.  HENT: Negative for congestion, mouth sores and sinus pressure.   Eyes: Negative for visual disturbance.  Respiratory: Negative for cough and chest tightness.   Gastrointestinal: Negative for nausea and abdominal pain.  Genitourinary: Negative for frequency, difficulty urinating and vaginal pain.  Musculoskeletal: Positive for arthralgias (better) and arthritis. Negative for back pain, joint swelling and gait problem.  Skin: Negative for pallor and rash.  Neurological: Negative for dizziness, tremors and weakness.  Psychiatric/Behavioral: Negative for confusion and sleep disturbance.       Objective:   Physical Exam   Constitutional: She appears well-developed and well-nourished. No distress.  HENT:  Head: Normocephalic.  Right Ear: External ear normal.  Left Ear: External ear normal.  Nose: Nose normal.  Mouth/Throat: Oropharynx is clear and moist.  Eyes: Conjunctivae are normal. Pupils are equal, round, and reactive to light. Right eye exhibits no discharge. Left eye exhibits no discharge.  Neck: Normal range of motion. Neck supple. No JVD present. No tracheal deviation present. No thyromegaly present.  Cardiovascular: Normal rate, regular rhythm and normal heart sounds.   Pulmonary/Chest: No stridor. No respiratory distress. She has no wheezes.  Abdominal: Soft. Bowel sounds are normal. She exhibits no distension and no mass. There is no tenderness. There is no rebound and no guarding.  Musculoskeletal: She exhibits no edema and no tenderness.  L thumb base is tender  Lymphadenopathy:    She has no cervical adenopathy.  Neurological: She displays normal reflexes. No cranial nerve deficit. She exhibits normal muscle tone. Coordination normal.  Skin: No rash noted. No erythema.  Psoriatic patches  Psychiatric: She has a normal mood and affect. Her behavior is normal. Judgment and thought content normal.    Lab Results  Component Value Date   WBC 3.8* 12/04/2010   HGB 12.2 12/04/2010   HCT 35.6* 12/04/2010   PLT 97.0* 12/04/2010   GLUCOSE 89 02/21/2013   CHOL 234* 12/04/2010   TRIG 95.0 12/04/2010   HDL 46.50 12/04/2010   LDLDIRECT 172.4 12/04/2010   ALT 18 02/21/2013   AST 25 02/21/2013   NA 137 02/21/2013   K 3.8 02/21/2013   CL 104 02/21/2013   CREATININE 1.0 02/21/2013   BUN 15 02/21/2013  CO2 25 02/21/2013   TSH 2.37 12/04/2010   INR 1.0 ratio 12/10/2009   HGBA1C 5.7 05/28/2010        Assessment & Plan:

## 2013-02-22 LAB — NMR LIPOPROFILE WITHOUT LIPIDS
HDL Particle Number: 23 umol/L — ABNORMAL LOW (ref >=30.5)
LDL Particle Number: 2129 nmol/L — ABNORMAL HIGH (ref <1000)
Large HDL-P: 1.7 umol/L — ABNORMAL LOW (ref >=4.8)
Large VLDL-P: <0.8 nmol/L (ref <=2.7)
Small LDL Particle Number: 269 nmol/L (ref <=527)
VLDL Size: 38.2 nm (ref <=46.6)

## 2013-04-11 DIAGNOSIS — S329XXA Fracture of unspecified parts of lumbosacral spine and pelvis, initial encounter for closed fracture: Secondary | ICD-10-CM

## 2013-04-11 HISTORY — DX: Fracture of unspecified parts of lumbosacral spine and pelvis, initial encounter for closed fracture: S32.9XXA

## 2013-06-24 ENCOUNTER — Encounter: Payer: Self-pay | Admitting: Internal Medicine

## 2013-06-24 ENCOUNTER — Ambulatory Visit (INDEPENDENT_AMBULATORY_CARE_PROVIDER_SITE_OTHER): Payer: Medicare Other | Admitting: Internal Medicine

## 2013-06-24 VITALS — BP 140/90 | HR 80 | Temp 98.1°F | Resp 16 | Wt 158.0 lb

## 2013-06-24 DIAGNOSIS — E039 Hypothyroidism, unspecified: Secondary | ICD-10-CM

## 2013-06-24 DIAGNOSIS — F411 Generalized anxiety disorder: Secondary | ICD-10-CM

## 2013-06-24 DIAGNOSIS — S329XXA Fracture of unspecified parts of lumbosacral spine and pelvis, initial encounter for closed fracture: Secondary | ICD-10-CM | POA: Insufficient documentation

## 2013-06-24 DIAGNOSIS — Z23 Encounter for immunization: Secondary | ICD-10-CM

## 2013-06-24 DIAGNOSIS — I10 Essential (primary) hypertension: Secondary | ICD-10-CM

## 2013-06-24 DIAGNOSIS — M545 Low back pain: Secondary | ICD-10-CM

## 2013-06-24 DIAGNOSIS — S329XXD Fracture of unspecified parts of lumbosacral spine and pelvis, subsequent encounter for fracture with routine healing: Secondary | ICD-10-CM

## 2013-06-24 MED ORDER — PREDNISOLONE 5 MG PO TABS: 5.0000 mg | ORAL_TABLET | Freq: Every day | ORAL | Status: DC

## 2013-06-24 NOTE — Assessment & Plan Note (Signed)
Continue with current prescription therapy as reflected on the Med list.  

## 2013-06-24 NOTE — Assessment & Plan Note (Signed)
Doing good  

## 2013-06-24 NOTE — Assessment & Plan Note (Signed)
9/14 R side Doing better

## 2013-06-24 NOTE — Progress Notes (Signed)
Subjective:   Pt fell off a ladder in 9/14 and had a R pelvic fx - went to rehab afterwards... 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 The disease course has been improving. She reports no pain, joint swelling or joint warmth. (L 4th toe, hands) The symptoms have been improving. Affected locations include the left wrist, right toes, right wrist and left MCP. Her pain is at a severity of 0/10. Pertinent negatives include no dry mouth, fatigue, rash or weight loss. There is no history of lupus, psoriasis or rheumatoid arthritis.  Her pertinent risk factors include overuse. (Gout) Past treatments include acetaminophen and NSAIDs (keflex). The treatment provided mild relief. Compliance with prior treatments has been good.   The patient presents for a follow-up of  chronic hypertension, chronic dyslipidemia    f/u LBP, arthralgias resolved  BP Readings from Last 3 Encounters:  06/24/13 140/90  02/21/13 150/90  11/22/12 128/88   Wt Readings from Last 3 Encounters:  06/24/13 158 lb (71.668 kg)  02/21/13 166 lb (75.297 kg)  11/22/12 163 lb (73.936 kg)      Review of Systems  Constitutional: Negative for chills, weight loss, activity change, appetite change, fatigue and unexpected weight change.  HENT: Negative for congestion, mouth sores and sinus pressure.   Eyes: Negative for visual disturbance.  Respiratory: Negative for cough and chest tightness.   Gastrointestinal: Negative for nausea and abdominal pain.  Genitourinary: Negative for frequency, difficulty urinating and vaginal pain.  Musculoskeletal: Positive for arthralgias (better) and arthritis. Negative for back pain, gait problem and joint swelling.  Skin: Negative for pallor and rash.  Neurological: Negative for dizziness, tremors and weakness.  Psychiatric/Behavioral: Negative for confusion and  sleep disturbance.       Objective:   Physical Exam  Constitutional: She appears well-developed and well-nourished. No distress.  HENT:  Head: Normocephalic.  Right Ear: External ear normal.  Left Ear: External ear normal.  Nose: Nose normal.  Mouth/Throat: Oropharynx is clear and moist.  Eyes: Conjunctivae are normal. Pupils are equal, round, and reactive to light. Right eye exhibits no discharge. Left eye exhibits no discharge.  Neck: Normal range of motion. Neck supple. No JVD present. No tracheal deviation present. No thyromegaly present.  Cardiovascular: Normal rate, regular rhythm and normal heart sounds.   Pulmonary/Chest: No stridor. No respiratory distress. She has no wheezes.  Abdominal: Soft. Bowel sounds are normal. She exhibits no distension and no mass. There is no tenderness. There is no rebound and no guarding.  Musculoskeletal: She exhibits no edema and no tenderness.  L thumb base is tender  Lymphadenopathy:    She has no cervical adenopathy.  Neurological: She displays normal reflexes. No cranial nerve deficit. She exhibits normal muscle tone. Coordination normal.  Skin: No rash noted. No erythema.  Psoriatic patches  Psychiatric: She has a normal mood and affect. Her behavior is normal. Judgment and thought content normal.   R groin is tender  Lab Results  Component Value Date   WBC 3.8* 12/04/2010   HGB 12.2 12/04/2010   HCT 35.6* 12/04/2010   PLT 97.0* 12/04/2010   GLUCOSE 89 02/21/2013   CHOL 234* 12/04/2010   TRIG 95.0 12/04/2010   HDL 46.50 12/04/2010   LDLDIRECT 172.4 12/04/2010   ALT 18 02/21/2013   AST 25 02/21/2013   NA 137 02/21/2013  K 3.8 02/21/2013   CL 104 02/21/2013   CREATININE 1.0 02/21/2013   BUN 15 02/21/2013   CO2 25 02/21/2013   TSH 2.37 12/04/2010   INR 1.0 ratio 12/10/2009   HGBA1C 5.7 05/28/2010        Assessment & Plan:

## 2013-07-04 ENCOUNTER — Telehealth: Payer: Self-pay | Admitting: *Deleted

## 2013-07-04 NOTE — Telephone Encounter (Signed)
Tammy, pharmacist, called states Rx for Prednisolone 5mg  was to be for Prednisone 5mg  per pt.  Please advise

## 2013-07-05 ENCOUNTER — Ambulatory Visit (INDEPENDENT_AMBULATORY_CARE_PROVIDER_SITE_OTHER): Payer: Medicare Other

## 2013-07-05 ENCOUNTER — Other Ambulatory Visit: Payer: Self-pay | Admitting: *Deleted

## 2013-07-05 DIAGNOSIS — Z23 Encounter for immunization: Secondary | ICD-10-CM

## 2013-07-05 MED ORDER — PREDNISONE 5 MG PO TABS: 5.0000 mg | ORAL_TABLET | Freq: Every day | ORAL | Status: DC

## 2013-07-05 NOTE — Telephone Encounter (Signed)
Ok Thx 

## 2013-07-05 NOTE — Telephone Encounter (Signed)
Rx sent to [facility] pharmacy, pt notified

## 2013-09-08 ENCOUNTER — Telehealth: Payer: Self-pay | Admitting: *Deleted

## 2013-09-08 DIAGNOSIS — H9110 Presbycusis, unspecified ear: Secondary | ICD-10-CM

## 2013-09-08 NOTE — Telephone Encounter (Signed)
Pt left vm requesting referral for hearing screening to be faxed to [phone].

## 2013-09-09 NOTE — Telephone Encounter (Signed)
Ok Thx 

## 2013-09-28 ENCOUNTER — Encounter: Payer: Self-pay | Admitting: Internal Medicine

## 2013-09-28 ENCOUNTER — Ambulatory Visit (INDEPENDENT_AMBULATORY_CARE_PROVIDER_SITE_OTHER)
Admission: RE | Admit: 2013-09-28 | Discharge: 2013-09-28 | Disposition: A | Discharge status: Home / Self Care | Payer: Medicare Other | Source: Ambulatory Visit | Attending: Internal Medicine | Admitting: Internal Medicine

## 2013-09-28 ENCOUNTER — Ambulatory Visit: Payer: Medicare Other | Admitting: Internal Medicine

## 2013-09-28 ENCOUNTER — Ambulatory Visit (INDEPENDENT_AMBULATORY_CARE_PROVIDER_SITE_OTHER): Payer: Medicare Other | Admitting: Internal Medicine

## 2013-09-28 VITALS — BP 150/98 | HR 76 | Temp 96.6°F | Resp 16 | Wt 159.0 lb

## 2013-09-28 DIAGNOSIS — M129 Arthropathy, unspecified: Secondary | ICD-10-CM

## 2013-09-28 DIAGNOSIS — H9110 Presbycusis, unspecified ear: Secondary | ICD-10-CM

## 2013-09-28 DIAGNOSIS — E785 Hyperlipidemia, unspecified: Secondary | ICD-10-CM

## 2013-09-28 DIAGNOSIS — I10 Essential (primary) hypertension: Secondary | ICD-10-CM

## 2013-09-28 DIAGNOSIS — M542 Cervicalgia: Secondary | ICD-10-CM | POA: Insufficient documentation

## 2013-09-28 DIAGNOSIS — M1990 Unspecified osteoarthritis, unspecified site: Secondary | ICD-10-CM

## 2013-09-28 DIAGNOSIS — F411 Generalized anxiety disorder: Secondary | ICD-10-CM

## 2013-09-28 NOTE — Assessment & Plan Note (Signed)
Risks associated with steroid treatment noncompliance were discussed. Compliance was encouraged. X ray Contour pillow

## 2013-09-28 NOTE — Progress Notes (Signed)
Pre visit review using our clinic review tool, if applicable. No additional management support is needed unless otherwise documented below in the visit note. 

## 2013-09-28 NOTE — Progress Notes (Signed)
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  C/o pain in neck, B shoulders and hands x weeks - Predn helps  Arthritis Presents for follow-up visit. The disease course has been improving. She reports no pain, joint swelling or joint warmth. (L 4th toe, hands) The symptoms have been stable. Affected locations include the left wrist, right toes, right wrist, neck, right shoulder, left shoulder, left elbow and right elbow. Her pain is at a severity of 6/10. Pertinent negatives include no dry mouth, fatigue, rash or weight loss. There is no history of lupus, psoriasis or rheumatoid arthritis.  Her pertinent risk factors include overuse. (Gout) Past treatments include corticosteroids (keflex). The treatment provided significant relief. Compliance with prior treatments has been good. Compliance with total regimen is 0-25%.   The patient presents for a follow-up of  chronic hypertension, chronic dyslipidemia    f/u LBP, arthralgias resolved  BP Readings from Last 3 Encounters:  09/28/13 150/98  06/24/13 140/90  02/21/13 150/90   Wt Readings from Last 3 Encounters:  09/28/13 159 lb (72.122 kg)  06/24/13 158 lb (71.668 kg)  02/21/13 166 lb (75.297 kg)      Review of Systems  Constitutional: Negative for chills, weight loss, activity change, appetite change, fatigue and unexpected weight change.  HENT: Negative for congestion, mouth sores and sinus pressure.   Eyes: Negative for visual disturbance.  Respiratory: Negative for cough and chest tightness.   Gastrointestinal: Negative for nausea and abdominal pain.  Genitourinary: Negative for frequency, difficulty urinating and vaginal pain.  Musculoskeletal: Positive for arthralgias (better) and arthritis. Negative for back pain, gait problem and joint swelling.  Skin: Negative for pallor and rash.  Neurological: Negative for dizziness, tremors and weakness.   Psychiatric/Behavioral: Negative for confusion and sleep disturbance.       Objective:   Physical Exam  Constitutional: She appears well-developed and well-nourished. No distress.  HENT:  Head: Normocephalic.  Right Ear: External ear normal.  Left Ear: External ear normal.  Nose: Nose normal.  Mouth/Throat: Oropharynx is clear and moist.  Eyes: Conjunctivae are normal. Pupils are equal, round, and reactive to light. Right eye exhibits no discharge. Left eye exhibits no discharge.  Neck: Normal range of motion. Neck supple. No JVD present. No tracheal deviation present. No thyromegaly present.  Cardiovascular: Normal rate, regular rhythm and normal heart sounds.   Pulmonary/Chest: No stridor. No respiratory distress. She has no wheezes.  Abdominal: Soft. Bowel sounds are normal. She exhibits no distension and no mass. There is no tenderness. There is no rebound and no guarding.  Musculoskeletal: She exhibits no edema and no tenderness.  L thumb base is tender  Lymphadenopathy:    She has no cervical adenopathy.  Neurological: She displays normal reflexes. No cranial nerve deficit. She exhibits normal muscle tone. Coordination normal.  Skin: No rash noted. No erythema.  Psoriatic patches  Psychiatric: She has a normal mood and affect. Her behavior is normal. Judgment and thought content normal.    Lab Results  Component Value Date   WBC 3.8* 12/04/2010   HGB 12.2 12/04/2010   HCT 35.6* 12/04/2010   PLT 97.0* 12/04/2010   GLUCOSE 89 02/21/2013   CHOL 234* 12/04/2010   TRIG 95.0 12/04/2010   HDL 46.50 12/04/2010   LDLDIRECT 172.4 12/04/2010   ALT 18 02/21/2013   AST 25 02/21/2013   NA 137 02/21/2013   K 3.8 02/21/2013   CL  104 02/21/2013   CREATININE 1.0 02/21/2013   BUN 15 02/21/2013   CO2 25 02/21/2013   TSH 2.37 12/04/2010   INR 1.0 ratio 12/10/2009   HGBA1C 5.7 05/28/2010        Assessment & Plan:

## 2013-09-28 NOTE — Patient Instructions (Signed)
Contour pillow  

## 2013-09-29 NOTE — Assessment & Plan Note (Signed)
  On diet  

## 2013-09-29 NOTE — Assessment & Plan Note (Signed)
Chronic  Poss psoriasis related. OA Continue with current prescription therapy as reflected on the Med list.

## 2013-09-29 NOTE — Assessment & Plan Note (Signed)
Doing better.   

## 2013-09-29 NOTE — Assessment & Plan Note (Signed)
Continue with current prescription therapy as reflected on the Med list.  

## 2013-11-09 ENCOUNTER — Telehealth: Payer: Self-pay | Admitting: *Deleted

## 2013-11-09 NOTE — Telephone Encounter (Signed)
Pt called states [name] [name] wants to know if its ok for her to take Celebrex.  Please advise

## 2013-11-09 NOTE — Telephone Encounter (Signed)
Spoke with pt advised of MDs message 

## 2013-11-09 NOTE — Telephone Encounter (Signed)
OK x 2-4 wks if no upset stomach from it Thx

## 2013-11-10 ENCOUNTER — Other Ambulatory Visit: Payer: Self-pay | Admitting: *Deleted

## 2013-11-10 MED ORDER — LEVOTHYROXINE SODIUM 25 MCG PO TABS: 25.0000 ug | ORAL_TABLET | Freq: Every day | ORAL | Status: DC

## 2013-11-10 MED ORDER — AMLODIPINE BESYLATE 5 MG PO TABS: 5.0000 mg | ORAL_TABLET | Freq: Every day | ORAL | Status: DC

## 2013-12-26 ENCOUNTER — Encounter: Payer: Self-pay | Admitting: Internal Medicine

## 2013-12-26 ENCOUNTER — Ambulatory Visit (INDEPENDENT_AMBULATORY_CARE_PROVIDER_SITE_OTHER): Payer: Medicare Other | Admitting: Internal Medicine

## 2013-12-26 VITALS — BP 160/100 | HR 80 | Temp 96.9°F | Resp 16 | Wt 166.0 lb

## 2013-12-26 DIAGNOSIS — I10 Essential (primary) hypertension: Secondary | ICD-10-CM

## 2013-12-26 DIAGNOSIS — M545 Low back pain, unspecified: Secondary | ICD-10-CM

## 2013-12-26 DIAGNOSIS — M1990 Unspecified osteoarthritis, unspecified site: Secondary | ICD-10-CM

## 2013-12-26 DIAGNOSIS — M129 Arthropathy, unspecified: Secondary | ICD-10-CM

## 2013-12-26 DIAGNOSIS — L4050 Arthropathic psoriasis, unspecified: Secondary | ICD-10-CM

## 2013-12-26 DIAGNOSIS — E039 Hypothyroidism, unspecified: Secondary | ICD-10-CM

## 2013-12-26 DIAGNOSIS — F411 Generalized anxiety disorder: Secondary | ICD-10-CM

## 2013-12-26 MED ORDER — LORAZEPAM 1 MG PO TABS: 1.0000 mg | ORAL_TABLET | Freq: Two times a day (BID) | ORAL | Status: DC | PRN

## 2013-12-26 NOTE — Assessment & Plan Note (Signed)
Tramadol prn 

## 2013-12-26 NOTE — Assessment & Plan Note (Signed)
Continue with current prescription therapy as reflected on the Med list.  

## 2013-12-26 NOTE — Assessment & Plan Note (Signed)
Chronic - s/p Derm eval: she was told "no psoriasis" Poss psoriasis related???. OA

## 2013-12-26 NOTE — Progress Notes (Signed)
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 The disease course has been improving. She reports no pain, joint swelling or joint warmth. (L 4th toe, hands) The symptoms have been stable. Affected locations include the left wrist, right toes, right wrist, neck, right shoulder, left shoulder, left elbow and right elbow. Her pain is at a severity of 0/10. Pertinent negatives include no dry mouth, fatigue, rash or weight loss. There is no history of lupus, psoriasis or rheumatoid arthritis.  Her pertinent risk factors include overuse. (Gout) Past treatments include corticosteroids (keflex). The treatment provided significant relief. Compliance with prior treatments has been good. Compliance with total regimen is 0-25%. Compliance with medications is 76-100%.   The patient presents for a follow-up of  chronic hypertension, chronic dyslipidemia    f/u LBP, arthralgias resolved  BP Readings from Last 3 Encounters:  12/26/13 160/100  09/28/13 150/98  06/24/13 140/90   Wt Readings from Last 3 Encounters:  12/26/13 166 lb (75.297 kg)  09/28/13 159 lb (72.122 kg)  06/24/13 158 lb (71.668 kg)      Review of Systems  Constitutional: Negative for chills, weight loss, activity change, appetite change, fatigue and unexpected weight change.  HENT: Negative for congestion, mouth sores and sinus pressure.   Eyes: Negative for visual disturbance.  Respiratory: Negative for cough and chest tightness.   Gastrointestinal: Negative for nausea and abdominal pain.  Genitourinary: Negative for frequency, difficulty urinating and vaginal pain.  Musculoskeletal: Positive for arthralgias (better) and arthritis. Negative for back pain, gait problem and joint swelling.  Skin: Negative for pallor and rash.  Neurological: Negative for dizziness, tremors and weakness.   Psychiatric/Behavioral: Negative for confusion and sleep disturbance.       Objective:   Physical Exam  Constitutional: She appears well-developed and well-nourished. No distress.  HENT:  Head: Normocephalic.  Right Ear: External ear normal.  Left Ear: External ear normal.  Nose: Nose normal.  Mouth/Throat: Oropharynx is clear and moist.  Eyes: Conjunctivae are normal. Pupils are equal, round, and reactive to light. Right eye exhibits no discharge. Left eye exhibits no discharge.  Neck: Normal range of motion. Neck supple. No JVD present. No tracheal deviation present. No thyromegaly present.  Cardiovascular: Normal rate, regular rhythm and normal heart sounds.   Pulmonary/Chest: No stridor. No respiratory distress. She has no wheezes.  Abdominal: Soft. Bowel sounds are normal. She exhibits no distension and no mass. There is no tenderness. There is no rebound and no guarding.  Musculoskeletal: She exhibits no edema and no tenderness.  L thumb base is tender  Lymphadenopathy:    She has no cervical adenopathy.  Neurological: She displays normal reflexes. No cranial nerve deficit. She exhibits normal muscle tone. Coordination normal.  Skin: No rash noted. No erythema.  Psoriatic patches  Psychiatric: She has a normal mood and affect. Her behavior is normal. Judgment and thought content normal.    Lab Results  Component Value Date   WBC 3.8* 12/04/2010   HGB 12.2 12/04/2010   HCT 35.6* 12/04/2010   PLT 97.0* 12/04/2010   GLUCOSE 89 02/21/2013   CHOL 234* 12/04/2010   TRIG 95.0 12/04/2010   HDL 46.50 12/04/2010   LDLDIRECT 172.4 12/04/2010   ALT 18 02/21/2013   AST 25 02/21/2013   NA 137 02/21/2013   K 3.8 02/21/2013   CL 104 02/21/2013  CREATININE 1.0 02/21/2013   BUN 15 02/21/2013   CO2 25 02/21/2013   TSH 2.37 12/04/2010   INR 1.0 ratio 12/10/2009   HGBA1C 5.7 05/28/2010        Assessment & Plan:

## 2013-12-26 NOTE — Progress Notes (Signed)
Pre visit review using our clinic review tool, if applicable. No additional management support is needed unless otherwise documented below in the visit note. 

## 2013-12-26 NOTE — Assessment & Plan Note (Signed)
Chronic - s/p Derm eval: she was told "no psoriasis" Poss psoriasis related???. OA OK to d/c Prednisone per dr Charlestine Night Tramadol prn

## 2013-12-27 ENCOUNTER — Telehealth: Payer: Self-pay | Admitting: Internal Medicine

## 2013-12-27 NOTE — Telephone Encounter (Signed)
Relevant patient education assigned to patient using Emmi. ° °

## 2014-01-13 ENCOUNTER — Encounter: Payer: Self-pay | Admitting: Internal Medicine

## 2014-03-30 ENCOUNTER — Ambulatory Visit (INDEPENDENT_AMBULATORY_CARE_PROVIDER_SITE_OTHER): Payer: Medicare Other | Admitting: Internal Medicine

## 2014-03-30 ENCOUNTER — Encounter: Payer: Self-pay | Admitting: Internal Medicine

## 2014-03-30 ENCOUNTER — Other Ambulatory Visit (INDEPENDENT_AMBULATORY_CARE_PROVIDER_SITE_OTHER): Payer: Medicare Other

## 2014-03-30 VITALS — BP 144/90 | HR 76 | Temp 98.0°F | Resp 16 | Wt 165.0 lb

## 2014-03-30 DIAGNOSIS — K7689 Other specified diseases of liver: Secondary | ICD-10-CM

## 2014-03-30 DIAGNOSIS — E039 Hypothyroidism, unspecified: Secondary | ICD-10-CM

## 2014-03-30 DIAGNOSIS — L4050 Arthropathic psoriasis, unspecified: Secondary | ICD-10-CM

## 2014-03-30 DIAGNOSIS — R945 Abnormal results of liver function studies: Secondary | ICD-10-CM

## 2014-03-30 DIAGNOSIS — F411 Generalized anxiety disorder: Secondary | ICD-10-CM

## 2014-03-30 DIAGNOSIS — R799 Abnormal finding of blood chemistry, unspecified: Secondary | ICD-10-CM

## 2014-03-30 DIAGNOSIS — R21 Rash and other nonspecific skin eruption: Secondary | ICD-10-CM

## 2014-03-30 DIAGNOSIS — I10 Essential (primary) hypertension: Secondary | ICD-10-CM

## 2014-03-30 LAB — URINALYSIS, ROUTINE W REFLEX MICROSCOPIC
Bilirubin Urine: NEGATIVE
Ketones, ur: NEGATIVE
NITRITE: NEGATIVE
Specific Gravity, Urine: 1.01 (ref 1.000–1.030)
UROBILINOGEN UA: 0.2 (ref 0.0–1.0)
Urine Glucose: NEGATIVE
pH: 7 (ref 5.0–8.0)

## 2014-03-30 LAB — SEDIMENTATION RATE: SED RATE: 32 mm/h — AB (ref 0–22)

## 2014-03-30 LAB — CBC WITH DIFFERENTIAL/PLATELET
BASOS PCT: 0.6 % (ref 0.0–3.0)
Basophils Absolute: 0 10*3/uL (ref 0.0–0.1)
EOS PCT: 0.2 % (ref 0.0–5.0)
Eosinophils Absolute: 0 10*3/uL (ref 0.0–0.7)
HCT: 35.9 % — ABNORMAL LOW (ref 36.0–46.0)
Hemoglobin: 12 g/dL (ref 12.0–15.0)
LYMPHS PCT: 28 % (ref 12.0–46.0)
Lymphs Abs: 1.5 10*3/uL (ref 0.7–4.0)
MCHC: 33.4 g/dL (ref 30.0–36.0)
MCV: 79.5 fl (ref 78.0–100.0)
Monocytes Absolute: 0.4 10*3/uL (ref 0.1–1.0)
Monocytes Relative: 8.5 % (ref 3.0–12.0)
NEUTROS PCT: 62.7 % (ref 43.0–77.0)
Neutro Abs: 3.3 10*3/uL (ref 1.4–7.7)
Platelets: 63 10*3/uL — ABNORMAL LOW (ref 150.0–400.0)
RBC: 4.51 Mil/uL (ref 3.87–5.11)
RDW: 14.9 % (ref 11.5–15.5)
WBC: 5.3 10*3/uL (ref 4.0–10.5)

## 2014-03-30 LAB — HEPATIC FUNCTION PANEL
ALK PHOS: 69 U/L (ref 39–117)
ALT: 12 U/L (ref 0–35)
AST: 20 U/L (ref 0–37)
Albumin: 3.8 g/dL (ref 3.5–5.2)
BILIRUBIN TOTAL: 0.5 mg/dL (ref 0.2–1.2)
Bilirubin, Direct: 0.1 mg/dL (ref 0.0–0.3)
Total Protein: 7.6 g/dL (ref 6.0–8.3)

## 2014-03-30 LAB — LIPID PANEL
CHOLESTEROL: 212 mg/dL — AB (ref 0–200)
HDL: 44.1 mg/dL (ref >39.00)
LDL CALC: 157 mg/dL — AB (ref 0–99)
NONHDL: 167.9
Total CHOL/HDL Ratio: 5
Triglycerides: 57 mg/dL (ref 0.0–149.0)
VLDL: 11.4 mg/dL (ref 0.0–40.0)

## 2014-03-30 LAB — BASIC METABOLIC PANEL
BUN: 13 mg/dL (ref 6–23)
CHLORIDE: 104 meq/L (ref 96–112)
CO2: 28 mEq/L (ref 19–32)
Calcium: 9.2 mg/dL (ref 8.4–10.5)
Creatinine, Ser: 0.8 mg/dL (ref 0.4–1.2)
GFR: 72.41 mL/min (ref >60.00)
Glucose, Bld: 94 mg/dL (ref 70–99)
Potassium: 3.8 mEq/L (ref 3.5–5.1)
Sodium: 138 mEq/L (ref 135–145)

## 2014-03-30 LAB — TSH: TSH: 1.56 u[IU]/mL (ref 0.35–4.50)

## 2014-03-30 MED ORDER — CLOBETASOL PROPIONATE 0.05 % EX OINT: TOPICAL_OINTMENT | Freq: Two times a day (BID) | CUTANEOUS | Status: DC

## 2014-03-30 NOTE — Assessment & Plan Note (Signed)
Fatty liver 

## 2014-03-30 NOTE — Assessment & Plan Note (Signed)
Doing well 

## 2014-03-30 NOTE — Progress Notes (Signed)
Pre visit review using our clinic review tool, if applicable. No additional management support is needed unless otherwise documented below in the visit note. 

## 2014-03-30 NOTE — Assessment & Plan Note (Signed)
Clobetasol prn

## 2014-03-30 NOTE — Progress Notes (Signed)
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 She had a L shoulder injection F/u pain in neck, B shoulders and hands x weeks  Arthritis Presents for follow-up visit. The disease course has been improving. She reports no pain, joint swelling or joint warmth. (L 4th toe, hands) The symptoms have been stable. Affected locations include the left wrist, right toes, right wrist, neck, right shoulder, left shoulder, left elbow and right elbow. Her pain is at a severity of 0/10. Pertinent negatives include no dry mouth, fatigue, rash or weight loss. There is no history of lupus, psoriasis or rheumatoid arthritis.  Her pertinent risk factors include overuse. (Gout) Past treatments include corticosteroids (keflex). The treatment provided significant relief. Compliance with prior treatments has been good. Compliance with total regimen is 0-25%. Compliance with medications is 76-100%.   The patient presents for a follow-up of  chronic hypertension, chronic dyslipidemia    f/u LBP, arthralgias resolved  BP Readings from Last 3 Encounters:  03/30/14 144/90  12/26/13 160/100  09/28/13 150/98   Wt Readings from Last 3 Encounters:  03/30/14 165 lb (74.844 kg)  12/26/13 166 lb (75.297 kg)  09/28/13 159 lb (72.122 kg)      Review of Systems  Constitutional: Negative for chills, weight loss, activity change, appetite change, fatigue and unexpected weight change.  HENT: Negative for congestion, mouth sores and sinus pressure.   Eyes: Negative for visual disturbance.  Respiratory: Negative for cough and chest tightness.   Gastrointestinal: Negative for nausea and abdominal pain.  Genitourinary: Negative for frequency, difficulty urinating and vaginal pain.  Musculoskeletal: Positive for arthralgias (better) and arthritis. Negative for back pain, gait problem and joint swelling.  Skin: Negative for pallor and rash.  Neurological: Negative for dizziness, tremors and weakness.  Psychiatric/Behavioral:  Negative for confusion and sleep disturbance.       Objective:   Physical Exam  Constitutional: She appears well-developed and well-nourished. No distress.  HENT:  Head: Normocephalic.  Right Ear: External ear normal.  Left Ear: External ear normal.  Nose: Nose normal.  Mouth/Throat: Oropharynx is clear and moist.  Eyes: Conjunctivae are normal. Pupils are equal, round, and reactive to light. Right eye exhibits no discharge. Left eye exhibits no discharge.  Neck: Normal range of motion. Neck supple. No JVD present. No tracheal deviation present. No thyromegaly present.  Cardiovascular: Normal rate, regular rhythm and normal heart sounds.   Pulmonary/Chest: No stridor. No respiratory distress. She has no wheezes.  Abdominal: Soft. Bowel sounds are normal. She exhibits no distension and no mass. There is no tenderness. There is no rebound and no guarding.  Musculoskeletal: She exhibits no edema and no tenderness.  L thumb base is tender  Lymphadenopathy:    She has no cervical adenopathy.  Neurological: She displays normal reflexes. No cranial nerve deficit. She exhibits normal muscle tone. Coordination normal.  Skin: No rash noted. No erythema.  Psoriatic patches  Psychiatric: She has a normal mood and affect. Her behavior is normal. Judgment and thought content normal.    Lab Results  Component Value Date   WBC 3.8* 12/04/2010   HGB 12.2 12/04/2010   HCT 35.6* 12/04/2010   PLT 97.0* 12/04/2010   GLUCOSE 89 02/21/2013   CHOL 234* 12/04/2010   TRIG 95.0 12/04/2010   HDL 46.50 12/04/2010   LDLDIRECT 172.4 12/04/2010   ALT 18 02/21/2013   AST 25 02/21/2013   NA 137 02/21/2013   K 3.8 02/21/2013   CL 104 02/21/2013   CREATININE 1.0  02/21/2013   BUN 15 02/21/2013   CO2 25 02/21/2013   TSH 2.37 12/04/2010   INR 1.0 ratio 12/10/2009   HGBA1C 5.7 05/28/2010        Assessment & Plan:

## 2014-03-30 NOTE — Assessment & Plan Note (Signed)
CBC

## 2014-03-30 NOTE — Assessment & Plan Note (Signed)
Continue with current prescription therapy as reflected on the Med list.  

## 2014-03-30 NOTE — Assessment & Plan Note (Signed)
LFTs 

## 2014-04-06 ENCOUNTER — Telehealth: Payer: Self-pay | Admitting: Geriatric Medicine

## 2014-04-06 NOTE — Telephone Encounter (Signed)
Spoke with patient. She will keep her next appointment and come in to have labs drawn a couple days prior to the visit. She is going to eat foods that will raise her platelet count.

## 2014-04-06 NOTE — Telephone Encounter (Signed)
Message copied by Alger Memos on Thu Apr 06, 2014  4:01 PM ------      Message from: Cassandria Anger      Created: Thu Mar 30, 2014  9:02 PM       Erline Levine, please, inform patient that all labs are normal except for low platelets      Keep ROV. CBC, BMET prior      Thx       ------

## 2014-07-13 ENCOUNTER — Other Ambulatory Visit: Payer: Medicare Other

## 2014-07-31 ENCOUNTER — Encounter: Payer: Medicare Other | Admitting: Internal Medicine

## 2014-08-14 ENCOUNTER — Encounter: Payer: Self-pay | Admitting: Internal Medicine

## 2014-08-14 ENCOUNTER — Ambulatory Visit (INDEPENDENT_AMBULATORY_CARE_PROVIDER_SITE_OTHER): Payer: Commercial Managed Care - HMO | Admitting: Internal Medicine

## 2014-08-14 VITALS — BP 180/110 | HR 98 | Temp 98.2°F | Ht 66.0 in | Wt 164.0 lb

## 2014-08-14 DIAGNOSIS — I10 Essential (primary) hypertension: Secondary | ICD-10-CM

## 2014-08-14 DIAGNOSIS — R12 Heartburn: Secondary | ICD-10-CM

## 2014-08-14 DIAGNOSIS — F32A Depression, unspecified: Secondary | ICD-10-CM

## 2014-08-14 DIAGNOSIS — F329 Major depressive disorder, single episode, unspecified: Secondary | ICD-10-CM | POA: Diagnosis not present

## 2014-08-14 DIAGNOSIS — R072 Precordial pain: Secondary | ICD-10-CM | POA: Diagnosis not present

## 2014-08-14 DIAGNOSIS — Z0000 Encounter for general adult medical examination without abnormal findings: Secondary | ICD-10-CM

## 2014-08-14 MED ORDER — RANITIDINE HCL 150 MG PO TABS: 150.0000 mg | ORAL_TABLET | Freq: Two times a day (BID) | ORAL | Status: DC

## 2014-08-14 MED ORDER — PREDNISONE 5 MG PO TABS: 5.0000 mg | ORAL_TABLET | Freq: Every day | ORAL | Status: DC

## 2014-08-14 MED ORDER — LORAZEPAM 1 MG PO TABS: 1.0000 mg | ORAL_TABLET | Freq: Two times a day (BID) | ORAL | Status: DC | PRN

## 2014-08-14 MED ORDER — LOSARTAN POTASSIUM 100 MG PO TABS
100.0000 mg | ORAL_TABLET | Freq: Every day | ORAL | Status: DC
Start: 2014-08-14 — End: 2014-10-09

## 2014-08-14 NOTE — Assessment & Plan Note (Signed)
Continue with current prescription therapy as reflected on the Med list.  

## 2014-08-14 NOTE — Patient Instructions (Addendum)
Go to ER if chest pain  Preventive Care for Adults A healthy lifestyle and preventive care can promote health and wellness. Preventive health guidelines for women include the following key practices.  A routine yearly physical is a good way to check with your health care provider about your health and preventive screening. It is a chance to share any concerns and updates on your health and to receive a thorough exam.  Visit your dentist for a routine exam and preventive care every 6 months. Brush your teeth twice a day and floss once a day. Good oral hygiene prevents tooth decay and gum disease.  The frequency of eye exams is based on your age, health, family medical history, use of contact lenses, and other factors. Follow your health care provider's recommendations for frequency of eye exams.  Eat a healthy diet. Foods like vegetables, fruits, whole grains, low-fat dairy products, and lean protein foods contain the nutrients you need without too many calories. Decrease your intake of foods high in solid fats, added sugars, and salt. Eat the right amount of calories for you.Get information about a proper diet from your health care provider, if necessary.  Regular physical exercise is one of the most important things you can do for your health. Most adults should get at least 150 minutes of moderate-intensity exercise (any activity that increases your heart rate and causes you to sweat) each week. In addition, most adults need muscle-strengthening exercises on 2 or more days a week.  Maintain a healthy weight. The body mass index (BMI) is a screening tool to identify possible weight problems. It provides an estimate of body fat based on height and weight. Your health care provider can find your BMI and can help you achieve or maintain a healthy weight.For adults 20 years and older:  A BMI below 18.5 is considered underweight.  A BMI of 18.5 to 24.9 is normal.  A BMI of 25 to 29.9 is considered  overweight.  A BMI of 30 and above is considered obese.  Maintain normal blood lipids and cholesterol levels by exercising and minimizing your intake of saturated fat. Eat a balanced diet with plenty of fruit and vegetables. Blood tests for lipids and cholesterol should begin at age 82 and be repeated every 5 years. If your lipid or cholesterol levels are high, you are over 50, or you are at high risk for heart disease, you may need your cholesterol levels checked more frequently.Ongoing high lipid and cholesterol levels should be treated with medicines if diet and exercise are not working.  If you smoke, find out from your health care provider how to quit. If you do not use tobacco, do not start.  Lung cancer screening is recommended for adults aged 65-80 years who are at high risk for developing lung cancer because of a history of smoking. A yearly low-dose CT scan of the lungs is recommended for people who have at least a 30-pack-year history of smoking and are a current smoker or have quit within the past 15 years. A pack year of smoking is smoking an average of 1 pack of cigarettes a day for 1 year (for example: 1 pack a day for 30 years or 2 packs a day for 15 years). Yearly screening should continue until the smoker has stopped smoking for at least 15 years. Yearly screening should be stopped for people who develop a health problem that would prevent them from having lung cancer treatment.  If you are pregnant, do  not drink alcohol. If you are breastfeeding, be very cautious about drinking alcohol. If you are not pregnant and choose to drink alcohol, do not have more than 1 drink per day. One drink is considered to be 12 ounces (355 mL) of beer, 5 ounces (148 mL) of wine, or 1.5 ounces (44 mL) of liquor.  Avoid use of street drugs. Do not share needles with anyone. Ask for help if you need support or instructions about stopping the use of drugs.  High blood pressure causes heart disease and  increases the risk of stroke. Your blood pressure should be checked at least every 1 to 2 years. Ongoing high blood pressure should be treated with medicines if weight loss and exercise do not work.  If you are 32-13 years old, ask your health care provider if you should take aspirin to prevent strokes.  Diabetes screening involves taking a blood sample to check your fasting blood sugar level. This should be done once every 3 years, after age 61, if you are within normal weight and without risk factors for diabetes. Testing should be considered at a younger age or be carried out more frequently if you are overweight and have at least 1 risk factor for diabetes.  Breast cancer screening is essential preventive care for women. You should practice "breast self-awareness." This means understanding the normal appearance and feel of your breasts and may include breast self-examination. Any changes detected, no matter how small, should be reported to a health care provider. Women in their 17s and 30s should have a clinical breast exam (CBE) by a health care provider as part of a regular health exam every 1 to 3 years. After age 75, women should have a CBE every year. Starting at age 86, women should consider having a mammogram (breast X-ray test) every year. Women who have a family history of breast cancer should talk to their health care provider about genetic screening. Women at a high risk of breast cancer should talk to their health care providers about having an MRI and a mammogram every year.  Breast cancer gene (BRCA)-related cancer risk assessment is recommended for women who have family members with BRCA-related cancers. BRCA-related cancers include breast, ovarian, tubal, and peritoneal cancers. Having family members with these cancers may be associated with an increased risk for harmful changes (mutations) in the breast cancer genes BRCA1 and BRCA2. Results of the assessment will determine the need for  genetic counseling and BRCA1 and BRCA2 testing.  Routine pelvic exams to screen for cancer are no longer recommended for nonpregnant women who are considered low risk for cancer of the pelvic organs (ovaries, uterus, and vagina) and who do not have symptoms. Ask your health care provider if a screening pelvic exam is right for you.  If you have had past treatment for cervical cancer or a condition that could lead to cancer, you need Pap tests and screening for cancer for at least 20 years after your treatment. If Pap tests have been discontinued, your risk factors (such as having a new sexual partner) need to be reassessed to determine if screening should be resumed. Some women have medical problems that increase the chance of getting cervical cancer. In these cases, your health care provider may recommend more frequent screening and Pap tests.  The HPV test is an additional test that may be used for cervical cancer screening. The HPV test looks for the virus that can cause the cell changes on the cervix. The cells  collected during the Pap test can be tested for HPV. The HPV test could be used to screen women aged 32 years and older, and should be used in women of any age who have unclear Pap test results. After the age of 41, women should have HPV testing at the same frequency as a Pap test.  Colorectal cancer can be detected and often prevented. Most routine colorectal cancer screening begins at the age of 68 years and continues through age 12 years. However, your health care provider may recommend screening at an earlier age if you have risk factors for colon cancer. On a yearly basis, your health care provider may provide home test kits to check for hidden blood in the stool. Use of a small camera at the end of a tube, to directly examine the colon (sigmoidoscopy or colonoscopy), can detect the earliest forms of colorectal cancer. Talk to your health care provider about this at age 42, when routine  screening begins. Direct exam of the colon should be repeated every 5-10 years through age 56 years, unless early forms of pre-cancerous polyps or small growths are found.  People who are at an increased risk for hepatitis B should be screened for this virus. You are considered at high risk for hepatitis B if:  You were born in a country where hepatitis B occurs often. Talk with your health care provider about which countries are considered high risk.  Your parents were born in a high-risk country and you have not received a shot to protect against hepatitis B (hepatitis B vaccine).  You have HIV or AIDS.  You use needles to inject street drugs.  You live with, or have sex with, someone who has hepatitis B.  You get hemodialysis treatment.  You take certain medicines for conditions like cancer, organ transplantation, and autoimmune conditions.  Hepatitis C blood testing is recommended for all people born from [birth year] through 1965 and any individual with known risks for hepatitis C.  Practice safe sex. Use condoms and avoid high-risk sexual practices to reduce the spread of sexually transmitted infections (STIs). STIs include gonorrhea, chlamydia, syphilis, trichomonas, herpes, HPV, and human immunodeficiency virus (HIV). Herpes, HIV, and HPV are viral illnesses that have no cure. They can result in disability, cancer, and death.  You should be screened for sexually transmitted illnesses (STIs) including gonorrhea and chlamydia if:  You are sexually active and are younger than 24 years.  You are older than 24 years and your health care provider tells you that you are at risk for this type of infection.  Your sexual activity has changed since you were last screened and you are at an increased risk for chlamydia or gonorrhea. Ask your health care provider if you are at risk.  If you are at risk of being infected with HIV, it is recommended that you take a prescription medicine daily to  prevent HIV infection. This is called preexposure prophylaxis (PrEP). You are considered at risk if:  You are a heterosexual woman, are sexually active, and are at increased risk for HIV infection.  You take drugs by injection.  You are sexually active with a partner who has HIV.  Talk with your health care provider about whether you are at high risk of being infected with HIV. If you choose to begin PrEP, you should first be tested for HIV. You should then be tested every 3 months for as long as you are taking PrEP.  Osteoporosis is a disease in  which the bones lose minerals and strength with aging. This can result in serious bone fractures or breaks. The risk of osteoporosis can be identified using a bone density scan. Women ages 46 years and over and women at risk for fractures or osteoporosis should discuss screening with their health care providers. Ask your health care provider whether you should take a calcium supplement or vitamin D to reduce the rate of osteoporosis.  Menopause can be associated with physical symptoms and risks. Hormone replacement therapy is available to decrease symptoms and risks. You should talk to your health care provider about whether hormone replacement therapy is right for you.  Use sunscreen. Apply sunscreen liberally and repeatedly throughout the day. You should seek shade when your shadow is shorter than you. Protect yourself by wearing long sleeves, pants, a wide-brimmed hat, and sunglasses year round, whenever you are outdoors.  Once a month, do a whole body skin exam, using a mirror to look at the skin on your back. Tell your health care provider of new moles, moles that have irregular borders, moles that are larger than a pencil eraser, or moles that have changed in shape or color.  Stay current with required vaccines (immunizations).  Influenza vaccine. All adults should be immunized every year.  Tetanus, diphtheria, and acellular pertussis (Td, Tdap)  vaccine. Pregnant women should receive 1 dose of Tdap vaccine during each pregnancy. The dose should be obtained regardless of the length of time since the last dose. Immunization is preferred during the 27th-36th week of gestation. An adult who has not previously received Tdap or who does not know her vaccine status should receive 1 dose of Tdap. This initial dose should be followed by tetanus and diphtheria toxoids (Td) booster doses every 10 years. Adults with an unknown or incomplete history of completing a 3-dose immunization series with Td-containing vaccines should begin or complete a primary immunization series including a Tdap dose. Adults should receive a Td booster every 10 years.  Varicella vaccine. An adult without evidence of immunity to varicella should receive 2 doses or a second dose if she has previously received 1 dose. Pregnant females who do not have evidence of immunity should receive the first dose after pregnancy. This first dose should be obtained before leaving the health care facility. The second dose should be obtained 4-8 weeks after the first dose.  Human papillomavirus (HPV) vaccine. Females aged 13-26 years who have not received the vaccine previously should obtain the 3-dose series. The vaccine is not recommended for use in pregnant females. However, pregnancy testing is not needed before receiving a dose. If a female is found to be pregnant after receiving a dose, no treatment is needed. In that case, the remaining doses should be delayed until after the pregnancy. Immunization is recommended for any person with an immunocompromised condition through the age of 53 years if she did not get any or all doses earlier. During the 3-dose series, the second dose should be obtained 4-8 weeks after the first dose. The third dose should be obtained 24 weeks after the first dose and 16 weeks after the second dose.  Zoster vaccine. One dose is recommended for adults aged 57 years or older  unless certain conditions are present.  Measles, mumps, and rubella (MMR) vaccine. Adults born before [birth year] generally are considered immune to measles and mumps. Adults born in [birth year] or later should have 1 or more doses of MMR vaccine unless there is a contraindication to the vaccine or  there is laboratory evidence of immunity to each of the three diseases. A routine second dose of MMR vaccine should be obtained at least 28 days after the first dose for students attending postsecondary schools, health care workers, or international travelers. People who received inactivated measles vaccine or an unknown type of measles vaccine during 1963-1967 should receive 2 doses of MMR vaccine. People who received inactivated mumps vaccine or an unknown type of mumps vaccine before 1979 and are at high risk for mumps infection should consider immunization with 2 doses of MMR vaccine. For females of childbearing age, rubella immunity should be determined. If there is no evidence of immunity, females who are not pregnant should be vaccinated. If there is no evidence of immunity, females who are pregnant should delay immunization until after pregnancy. Unvaccinated health care workers born before [birth year] who lack laboratory evidence of measles, mumps, or rubella immunity or laboratory confirmation of disease should consider measles and mumps immunization with 2 doses of MMR vaccine or rubella immunization with 1 dose of MMR vaccine.  Pneumococcal 13-valent conjugate (PCV13) vaccine. When indicated, a person who is uncertain of her immunization history and has no record of immunization should receive the PCV13 vaccine. An adult aged 26 years or older who has certain medical conditions and has not been previously immunized should receive 1 dose of PCV13 vaccine. This PCV13 should be followed with a dose of pneumococcal polysaccharide (PPSV23) vaccine. The PPSV23 vaccine dose should be obtained at least 8 weeks after the dose of PCV13  vaccine. An adult aged 54 years or older who has certain medical conditions and previously received 1 or more doses of PPSV23 vaccine should receive 1 dose of PCV13. The PCV13 vaccine dose should be obtained 1 or more years after the last PPSV23 vaccine dose.  Pneumococcal polysaccharide (PPSV23) vaccine. When PCV13 is also indicated, PCV13 should be obtained first. All adults aged 35 years and older should be immunized. An adult younger than age 64 years who has certain medical conditions should be immunized. Any person who resides in a nursing home or long-term care facility should be immunized. An adult smoker should be immunized. People with an immunocompromised condition and certain other conditions should receive both PCV13 and PPSV23 vaccines. People with human immunodeficiency virus (HIV) infection should be immunized as soon as possible after diagnosis. Immunization during chemotherapy or radiation therapy should be avoided. Routine use of PPSV23 vaccine is not recommended for American Indians, [place] Natives, or people younger than 65 years unless there are medical conditions that require PPSV23 vaccine. When indicated, people who have unknown immunization and have no record of immunization should receive PPSV23 vaccine. One-time revaccination 5 years after the first dose of PPSV23 is recommended for people aged 19-64 years who have chronic kidney failure, nephrotic syndrome, asplenia, or immunocompromised conditions. People who received 1-2 doses of PPSV23 before age 61 years should receive another dose of PPSV23 vaccine at age 42 years or later if at least 5 years have passed since the previous dose. Doses of PPSV23 are not needed for people immunized with PPSV23 at or after age 57 years.  Meningococcal vaccine. Adults with asplenia or persistent complement component deficiencies should receive 2 doses of quadrivalent meningococcal conjugate (MenACWY-D) vaccine. The doses should be obtained at least  2 months apart. Microbiologists working with certain meningococcal bacteria, [employer] recruits, people at risk during an outbreak, and people who travel to or live in countries with a high rate of meningitis should be immunized.  A first-year college student up through age 2 years who is living in a residence hall should receive a dose if she did not receive a dose on or after her 16th birthday. Adults who have certain high-risk conditions should receive one or more doses of vaccine.  Hepatitis A vaccine. Adults who wish to be protected from this disease, have certain high-risk conditions, work with hepatitis A-infected animals, work in hepatitis A research labs, or travel to or work in countries with a high rate of hepatitis A should be immunized. Adults who were previously unvaccinated and who anticipate close contact with an international adoptee during the first 60 days after arrival in the [place] from a country with a high rate of hepatitis A should be immunized.  Hepatitis B vaccine. Adults who wish to be protected from this disease, have certain high-risk conditions, may be exposed to blood or other infectious body fluids, are household contacts or sex partners of hepatitis B positive people, are clients or workers in certain care facilities, or travel to or work in countries with a high rate of hepatitis B should be immunized.  Haemophilus influenzae type b (Hib) vaccine. A previously unvaccinated person with asplenia or sickle cell disease or having a scheduled splenectomy should receive 1 dose of Hib vaccine. Regardless of previous immunization, a recipient of a hematopoietic stem cell transplant should receive a 3-dose series 6-12 months after her successful transplant. Hib vaccine is not recommended for adults with HIV infection. Preventive Services / Frequency Ages 36 to 39 years  Blood pressure check.** / Every 1 to 2 years.  Lipid and cholesterol check.** / Every 5 years beginning  at age 79.  Clinical breast exam.** / Every 3 years for women in their 25s and 56s.  BRCA-related cancer risk assessment.** / For women who have family members with a BRCA-related cancer (breast, ovarian, tubal, or peritoneal cancers).  Pap test.** / Every 2 years from ages 72 through 49. Every 3 years starting at age 33 through age 66 or 68 with a history of 3 consecutive normal Pap tests.  HPV screening.** / Every 3 years from ages 23 through ages 82 to 8 with a history of 3 consecutive normal Pap tests.  Hepatitis C blood test.** / For any individual with known risks for hepatitis C.  Skin self-exam. / Monthly.  Influenza vaccine. / Every year.  Tetanus, diphtheria, and acellular pertussis (Tdap, Td) vaccine.** / Consult your health care provider. Pregnant women should receive 1 dose of Tdap vaccine during each pregnancy. 1 dose of Td every 10 years.  Varicella vaccine.** / Consult your health care provider. Pregnant females who do not have evidence of immunity should receive the first dose after pregnancy.  HPV vaccine. / 3 doses over 6 months, if 22 and younger. The vaccine is not recommended for use in pregnant females. However, pregnancy testing is not needed before receiving a dose.  Measles, mumps, rubella (MMR) vaccine.** / You need at least 1 dose of MMR if you were born in 1957 or later. You may also need a 2nd dose. For females of childbearing age, rubella immunity should be determined. If there is no evidence of immunity, females who are not pregnant should be vaccinated. If there is no evidence of immunity, females who are pregnant should delay immunization until after pregnancy.  Pneumococcal 13-valent conjugate (PCV13) vaccine.** / Consult your health care provider.  Pneumococcal polysaccharide (PPSV23) vaccine.** / 1 to 2 doses if you smoke cigarettes or if  you have certain conditions.  Meningococcal vaccine.** / 1 dose if you are age 21 to 7 years and a Occupational psychologist living in a residence hall, or have one of several medical conditions, you need to get vaccinated against meningococcal disease. You may also need additional booster doses.  Hepatitis A vaccine.** / Consult your health care provider.  Hepatitis B vaccine.** / Consult your health care provider.  Haemophilus influenzae type b (Hib) vaccine.** / Consult your health care provider. Ages 36 to 77 years  Blood pressure check.** / Every 1 to 2 years.  Lipid and cholesterol check.** / Every 5 years beginning at age 34 years.  Lung cancer screening. / Every year if you are aged 68-80 years and have a 30-pack-year history of smoking and currently smoke or have quit within the past 15 years. Yearly screening is stopped once you have quit smoking for at least 15 years or develop a health problem that would prevent you from having lung cancer treatment.  Clinical breast exam.** / Every year after age 25 years.  BRCA-related cancer risk assessment.** / For women who have family members with a BRCA-related cancer (breast, ovarian, tubal, or peritoneal cancers).  Mammogram.** / Every year beginning at age 40 years and continuing for as long as you are in good health. Consult with your health care provider.  Pap test.** / Every 3 years starting at age 9 years through age 45 or 6 years with a history of 3 consecutive normal Pap tests.  HPV screening.** / Every 3 years from ages 67 years through ages 51 to 61 years with a history of 3 consecutive normal Pap tests.  Fecal occult blood test (FOBT) of stool. / Every year beginning at age 55 years and continuing until age 53 years. You may not need to do this test if you get a colonoscopy every 10 years.  Flexible sigmoidoscopy or colonoscopy.** / Every 5 years for a flexible sigmoidoscopy or every 10 years for a colonoscopy beginning at age 65 years and continuing until age 65 years.  Hepatitis C blood test.** / For all people born from [birth year]  through 1965 and any individual with known risks for hepatitis C.  Skin self-exam. / Monthly.  Influenza vaccine. / Every year.  Tetanus, diphtheria, and acellular pertussis (Tdap/Td) vaccine.** / Consult your health care provider. Pregnant women should receive 1 dose of Tdap vaccine during each pregnancy. 1 dose of Td every 10 years.  Varicella vaccine.** / Consult your health care provider. Pregnant females who do not have evidence of immunity should receive the first dose after pregnancy.  Zoster vaccine.** / 1 dose for adults aged 50 years or older.  Measles, mumps, rubella (MMR) vaccine.** / You need at least 1 dose of MMR if you were born in 1957 or later. You may also need a 2nd dose. For females of childbearing age, rubella immunity should be determined. If there is no evidence of immunity, females who are not pregnant should be vaccinated. If there is no evidence of immunity, females who are pregnant should delay immunization until after pregnancy.  Pneumococcal 13-valent conjugate (PCV13) vaccine.** / Consult your health care provider.  Pneumococcal polysaccharide (PPSV23) vaccine.** / 1 to 2 doses if you smoke cigarettes or if you have certain conditions.  Meningococcal vaccine.** / Consult your health care provider.  Hepatitis A vaccine.** / Consult your health care provider.  Hepatitis B vaccine.** / Consult your health care provider.  Haemophilus influenzae type b (Hib) vaccine.** /  Consult your health care provider. Ages 27 years and over  Blood pressure check.** / Every 1 to 2 years.  Lipid and cholesterol check.** / Every 5 years beginning at age 51 years.  Lung cancer screening. / Every year if you are aged 59-80 years and have a 30-pack-year history of smoking and currently smoke or have quit within the past 15 years. Yearly screening is stopped once you have quit smoking for at least 15 years or develop a health problem that would prevent you from having lung cancer  treatment.  Clinical breast exam.** / Every year after age 23 years.  BRCA-related cancer risk assessment.** / For women who have family members with a BRCA-related cancer (breast, ovarian, tubal, or peritoneal cancers).  Mammogram.** / Every year beginning at age 74 years and continuing for as long as you are in good health. Consult with your health care provider.  Pap test.** / Every 3 years starting at age 76 years through age 24 or 69 years with 3 consecutive normal Pap tests. Testing can be stopped between 65 and 70 years with 3 consecutive normal Pap tests and no abnormal Pap or HPV tests in the past 10 years.  HPV screening.** / Every 3 years from ages 43 years through ages 68 or 67 years with a history of 3 consecutive normal Pap tests. Testing can be stopped between 65 and 70 years with 3 consecutive normal Pap tests and no abnormal Pap or HPV tests in the past 10 years.  Fecal occult blood test (FOBT) of stool. / Every year beginning at age 37 years and continuing until age 6 years. You may not need to do this test if you get a colonoscopy every 10 years.  Flexible sigmoidoscopy or colonoscopy.** / Every 5 years for a flexible sigmoidoscopy or every 10 years for a colonoscopy beginning at age 17 years and continuing until age 59 years.  Hepatitis C blood test.** / For all people born from [birth year] through 1963 and any individual with known risks for hepatitis C.  Osteoporosis screening.** / A one-time screening for women ages 61 years and over and women at risk for fractures or osteoporosis.  Skin self-exam. / Monthly.  Influenza vaccine. / Every year.  Tetanus, diphtheria, and acellular pertussis (Tdap/Td) vaccine.** / 1 dose of Td every 10 years.  Varicella vaccine.** / Consult your health care provider.  Zoster vaccine.** / 1 dose for adults aged 27 years or older.  Pneumococcal 13-valent conjugate (PCV13) vaccine.** / Consult your health care provider.  Pneumococcal  polysaccharide (PPSV23) vaccine.** / 1 dose for all adults aged 45 years and older.  Meningococcal vaccine.** / Consult your health care provider.  Hepatitis A vaccine.** / Consult your health care provider.  Hepatitis B vaccine.** / Consult your health care provider.  Haemophilus influenzae type b (Hib) vaccine.** / Consult your health care provider. ** Family history and personal history of risk and conditions may change your health care provider's recommendations. Document Released: 09/23/2001 Document Revised: 12/12/2013 Document Reviewed: 12/23/2010 [facility] Patient Information 2015 [place], [place]. This information is not intended to replace advice given to you by your health care provider. Make sure you discuss any questions you have with your health care provider.

## 2014-08-14 NOTE — Assessment & Plan Note (Signed)

## 2014-08-14 NOTE — Assessment & Plan Note (Addendum)
EKG Labs R/o CAD -- stress test

## 2014-08-14 NOTE — Progress Notes (Signed)
Pre visit review using our clinic review tool, if applicable. No additional management support is needed unless otherwise documented below in the visit note. 

## 2014-08-14 NOTE — Progress Notes (Signed)
   Subjective:   The patient is here for a wellness exam.  Dr Charlestine Night stopped prednisone. She had a L shoulder injection F/u pain in neck, B shoulders and hands x weeks C/o GERD x2 mo, daily, tired 140/80 re-checked  Arthritis She reports no joint swelling. Pertinent negatives include no fatigue or rash.   The patient presents for a follow-up of  chronic hypertension, chronic dyslipidemia    f/u LBP, arthralgias resolved  BP Readings from Last 3 Encounters:  08/14/14 178/98  03/30/14 144/90  12/26/13 160/100   Wt Readings from Last 3 Encounters:  08/14/14 164 lb (74.39 kg)  03/30/14 165 lb (74.844 kg)  12/26/13 166 lb (75.297 kg)      Review of Systems  Constitutional: Negative for chills, activity change, appetite change, fatigue and unexpected weight change.  HENT: Negative for congestion, mouth sores and sinus pressure.   Eyes: Negative for visual disturbance.  Respiratory: Negative for cough and chest tightness.   Gastrointestinal: Negative for nausea and abdominal pain.  Genitourinary: Negative for frequency, difficulty urinating and vaginal pain.  Musculoskeletal: Positive for arthralgias (better) and arthritis. Negative for back pain, joint swelling and gait problem.  Skin: Negative for pallor and rash.  Neurological: Negative for dizziness, tremors and weakness.  Psychiatric/Behavioral: Negative for confusion and sleep disturbance.       Objective:   Physical Exam  Constitutional: She appears well-developed. No distress.  HENT:  Head: Normocephalic.  Right Ear: External ear normal.  Left Ear: External ear normal.  Nose: Nose normal.  Mouth/Throat: Oropharynx is clear and moist.  Eyes: Conjunctivae are normal. Pupils are equal, round, and reactive to light. Right eye exhibits no discharge. Left eye exhibits no discharge.  Neck: Normal range of motion. Neck supple. No JVD present. No tracheal deviation present. No thyromegaly present.  Cardiovascular:  Normal rate, regular rhythm and normal heart sounds.   Pulmonary/Chest: No stridor. No respiratory distress. She has no wheezes.  Abdominal: Soft. Bowel sounds are normal. She exhibits no distension and no mass. There is no tenderness. There is no rebound and no guarding.  Musculoskeletal: She exhibits no edema or tenderness.  Lymphadenopathy:    She has no cervical adenopathy.  Neurological: She displays normal reflexes. No cranial nerve deficit. She exhibits normal muscle tone. Coordination normal.  Skin: No rash noted. No erythema.  Psychiatric: She has a normal mood and affect. Her behavior is normal. Judgment and thought content normal.    Lab Results  Component Value Date   WBC 5.3 03/30/2014   HGB 12.0 03/30/2014   HCT 35.9* 03/30/2014   PLT 63.0 Repeated and verified X2.* 03/30/2014   GLUCOSE 94 03/30/2014   CHOL 212* 03/30/2014   TRIG 57.0 03/30/2014   HDL 44.10 03/30/2014   LDLDIRECT 172.4 12/04/2010   LDLCALC 157* 03/30/2014   ALT 12 03/30/2014   AST 20 03/30/2014   NA 138 03/30/2014   K 3.8 03/30/2014   CL 104 03/30/2014   CREATININE 0.8 03/30/2014   BUN 13 03/30/2014   CO2 28 03/30/2014   TSH 1.56 03/30/2014   INR 1.0 ratio 12/10/2009   HGBA1C 5.7 05/28/2010    EKG    Assessment & Plan:

## 2014-08-14 NOTE — Assessment & Plan Note (Signed)
Will watch BP at home See Rx Labs

## 2014-08-16 ENCOUNTER — Other Ambulatory Visit (INDEPENDENT_AMBULATORY_CARE_PROVIDER_SITE_OTHER): Payer: Commercial Managed Care - HMO

## 2014-08-16 DIAGNOSIS — R12 Heartburn: Secondary | ICD-10-CM

## 2014-08-16 DIAGNOSIS — I10 Essential (primary) hypertension: Secondary | ICD-10-CM | POA: Diagnosis not present

## 2014-08-16 DIAGNOSIS — F329 Major depressive disorder, single episode, unspecified: Secondary | ICD-10-CM

## 2014-08-16 DIAGNOSIS — R072 Precordial pain: Secondary | ICD-10-CM

## 2014-08-16 DIAGNOSIS — F32A Depression, unspecified: Secondary | ICD-10-CM

## 2014-08-16 DIAGNOSIS — Z0000 Encounter for general adult medical examination without abnormal findings: Secondary | ICD-10-CM | POA: Diagnosis not present

## 2014-08-16 LAB — URINALYSIS, ROUTINE W REFLEX MICROSCOPIC
Bilirubin Urine: NEGATIVE
Ketones, ur: NEGATIVE
NITRITE: NEGATIVE
Specific Gravity, Urine: 1.02 (ref 1.000–1.030)
URINE GLUCOSE: NEGATIVE
UROBILINOGEN UA: 0.2 (ref 0.0–1.0)
pH: 6.5 (ref 5.0–8.0)

## 2014-08-16 LAB — LIPID PANEL
Cholesterol: 228 mg/dL — ABNORMAL HIGH (ref 0–200)
HDL: 38.1 mg/dL — AB (ref >39.00)
LDL Cholesterol: 173 mg/dL — ABNORMAL HIGH (ref 0–99)
NonHDL: 189.9
Total CHOL/HDL Ratio: 6
Triglycerides: 84 mg/dL (ref 0.0–149.0)
VLDL: 16.8 mg/dL (ref 0.0–40.0)

## 2014-08-16 LAB — CBC WITH DIFFERENTIAL/PLATELET
Basophils Absolute: 0 10*3/uL (ref 0.0–0.1)
Basophils Relative: 0.9 % (ref 0.0–3.0)
EOS PCT: 0.3 % (ref 0.0–5.0)
Eosinophils Absolute: 0 10*3/uL (ref 0.0–0.7)
HCT: 37.3 % (ref 36.0–46.0)
Hemoglobin: 12 g/dL (ref 12.0–15.0)
Lymphocytes Relative: 48.7 % — ABNORMAL HIGH (ref 12.0–46.0)
Lymphs Abs: 2.5 10*3/uL (ref 0.7–4.0)
MCHC: 32.2 g/dL (ref 30.0–36.0)
MCV: 80.1 fl (ref 78.0–100.0)
MONO ABS: 0.7 10*3/uL (ref 0.1–1.0)
Monocytes Relative: 13.7 % — ABNORMAL HIGH (ref 3.0–12.0)
Neutro Abs: 1.9 10*3/uL (ref 1.4–7.7)
Neutrophils Relative %: 36.4 % — ABNORMAL LOW (ref 43.0–77.0)
Platelets: 71 10*3/uL — ABNORMAL LOW (ref 150.0–400.0)
RBC: 4.66 Mil/uL (ref 3.87–5.11)
RDW: 16.1 % — AB (ref 11.5–15.5)
WBC: 5.2 10*3/uL (ref 4.0–10.5)

## 2014-08-16 LAB — BASIC METABOLIC PANEL
BUN: 16 mg/dL (ref 6–23)
CHLORIDE: 107 meq/L (ref 96–112)
CO2: 29 meq/L (ref 19–32)
Calcium: 9.4 mg/dL (ref 8.4–10.5)
Creatinine, Ser: 0.8 mg/dL (ref 0.4–1.2)
GFR: 69.37 mL/min (ref >60.00)
Glucose, Bld: 87 mg/dL (ref 70–99)
Potassium: 3.3 mEq/L — ABNORMAL LOW (ref 3.5–5.1)
SODIUM: 142 meq/L (ref 135–145)

## 2014-08-16 LAB — HEPATIC FUNCTION PANEL
ALBUMIN: 4.1 g/dL (ref 3.5–5.2)
ALK PHOS: 50 U/L (ref 39–117)
ALT: 20 U/L (ref 0–35)
AST: 25 U/L (ref 0–37)
Bilirubin, Direct: 0.1 mg/dL (ref 0.0–0.3)
Total Bilirubin: 0.6 mg/dL (ref 0.2–1.2)
Total Protein: 7.2 g/dL (ref 6.0–8.3)

## 2014-08-16 LAB — TSH: TSH: 2.8 u[IU]/mL (ref 0.35–4.50)

## 2014-08-18 ENCOUNTER — Telehealth: Payer: Self-pay | Admitting: *Deleted

## 2014-08-18 NOTE — Telephone Encounter (Signed)
What [place] TMST? Thx

## 2014-08-18 NOTE — Telephone Encounter (Signed)
I called pt to advise of lab results. She states she took her BP yesterday and got 125/68 and 118/64. Today she got 108/61 and 131/62. She does not want to do TMST as she feels her elevated BP is related to Dr visits. Please advise.

## 2014-08-21 NOTE — Telephone Encounter (Signed)
Treadmill Stress Test? Sorry

## 2014-08-21 NOTE — Telephone Encounter (Signed)
Stress test was ordered for her heartburn pain that can be a heart disease symptom. Pls go ahead w/a stress test Thxx

## 2014-08-22 NOTE — Telephone Encounter (Signed)
Pt informed

## 2014-09-14 ENCOUNTER — Ambulatory Visit (INDEPENDENT_AMBULATORY_CARE_PROVIDER_SITE_OTHER): Payer: Commercial Managed Care - HMO | Admitting: Internal Medicine

## 2014-09-14 ENCOUNTER — Encounter: Payer: Self-pay | Admitting: Internal Medicine

## 2014-09-14 VITALS — BP 130/90 | HR 98 | Temp 98.4°F | Wt 163.0 lb

## 2014-09-14 DIAGNOSIS — L4050 Arthropathic psoriasis, unspecified: Secondary | ICD-10-CM | POA: Diagnosis not present

## 2014-09-14 DIAGNOSIS — E039 Hypothyroidism, unspecified: Secondary | ICD-10-CM | POA: Diagnosis not present

## 2014-09-14 MED ORDER — MIRABEGRON ER 50 MG PO TB24: 50.0000 mg | ORAL_TABLET | Freq: Every day | ORAL | Status: DC

## 2014-09-14 MED ORDER — TRAMADOL HCL 50 MG PO TABS: 50.0000 mg | ORAL_TABLET | Freq: Two times a day (BID) | ORAL | Status: DC | PRN

## 2014-09-14 MED ORDER — CIPROFLOXACIN HCL 250 MG PO TABS: 250.0000 mg | ORAL_TABLET | Freq: Two times a day (BID) | ORAL | Status: DC

## 2014-09-14 NOTE — Assessment & Plan Note (Signed)
Prednisone 2015  Potential benefits of a long term steroid  use as well as potential risks  and complications were explained to the patient and were aknowledged.

## 2014-09-14 NOTE — Progress Notes (Signed)
Pre visit review using our clinic review tool, if applicable. No additional management support is needed unless otherwise documented below in the visit note. 

## 2014-09-14 NOTE — Progress Notes (Signed)
   Subjective:   The patient is here for a wellness exam.  Dr Charlestine Night stopped prednisone. She had a L shoulder injection F/u pain in neck, B shoulders and hands x weeks F/u GERD x2 mo, daily, tired 140/80 re-checked  Arthritis Presents for follow-up visit. She reports no joint swelling. The symptoms have been stable. Pertinent negatives include no fatigue, fever or rash.   The patient presents for a follow-up of  chronic hypertension, chronic dyslipidemia    f/u LBP, arthralgias resolved  BP Readings from Last 3 Encounters:  09/14/14 130/90  08/14/14 180/110  03/30/14 144/90   Wt Readings from Last 3 Encounters:  09/14/14 163 lb (73.936 kg)  08/14/14 164 lb (74.39 kg)  03/30/14 165 lb (74.844 kg)      Review of Systems  Constitutional: Negative for fever, chills, activity change, appetite change, fatigue and unexpected weight change.  HENT: Negative for congestion, mouth sores and sinus pressure.   Eyes: Negative for visual disturbance.  Respiratory: Negative for cough and chest tightness.   Gastrointestinal: Negative for nausea and abdominal pain.  Genitourinary: Negative for frequency, difficulty urinating and vaginal pain.  Musculoskeletal: Positive for arthralgias (better) and arthritis. Negative for back pain, joint swelling and gait problem.  Skin: Negative for pallor and rash.  Neurological: Negative for dizziness, tremors and weakness.  Psychiatric/Behavioral: Negative for confusion and sleep disturbance.       Objective:   Physical Exam  Constitutional: She appears well-developed. No distress.  HENT:  Head: Normocephalic.  Right Ear: External ear normal.  Left Ear: External ear normal.  Nose: Nose normal.  Mouth/Throat: Oropharynx is clear and moist.  Eyes: Conjunctivae are normal. Pupils are equal, round, and reactive to light. Right eye exhibits no discharge. Left eye exhibits no discharge.  Neck: Normal range of motion. Neck supple. No JVD present. No  tracheal deviation present. No thyromegaly present.  Cardiovascular: Normal rate, regular rhythm and normal heart sounds.   Pulmonary/Chest: No stridor. No respiratory distress. She has no wheezes.  Abdominal: Soft. Bowel sounds are normal. She exhibits no distension and no mass. There is no tenderness. There is no rebound and no guarding.  Musculoskeletal: She exhibits no edema or tenderness.  Lymphadenopathy:    She has no cervical adenopathy.  Neurological: She displays normal reflexes. No cranial nerve deficit. She exhibits normal muscle tone. Coordination normal.  Skin: No rash noted. No erythema.  Psychiatric: She has a normal mood and affect. Her behavior is normal. Judgment and thought content normal.    Lab Results  Component Value Date   WBC 5.2 08/16/2014   HGB 12.0 08/16/2014   HCT 37.3 08/16/2014   PLT 71.0* 08/16/2014   GLUCOSE 87 08/16/2014   CHOL 228* 08/16/2014   TRIG 84.0 08/16/2014   HDL 38.10* 08/16/2014   LDLDIRECT 172.4 12/04/2010   LDLCALC 173* 08/16/2014   ALT 20 08/16/2014   AST 25 08/16/2014   NA 142 08/16/2014   K 3.3* 08/16/2014   CL 107 08/16/2014   CREATININE 0.8 08/16/2014   BUN 16 08/16/2014   CO2 29 08/16/2014   TSH 2.80 08/16/2014   INR 1.0 ratio 12/10/2009   HGBA1C 5.7 05/28/2010      Assessment & Plan:  Patient ID: [name] [name], female   DOB: [birth year], 81 y.o.   MRN: [id number]

## 2014-09-14 NOTE — Assessment & Plan Note (Signed)
Continue with current prescription therapy as reflected on the Med list.  

## 2014-10-09 ENCOUNTER — Other Ambulatory Visit: Payer: Self-pay | Admitting: *Deleted

## 2014-10-09 MED ORDER — LOSARTAN POTASSIUM 100 MG PO TABS: 100.0000 mg | ORAL_TABLET | Freq: Every day | ORAL | Status: DC

## 2014-11-20 ENCOUNTER — Telehealth: Payer: Self-pay | Admitting: Internal Medicine

## 2014-11-20 MED ORDER — AMLODIPINE BESYLATE 5 MG PO TABS: 5.0000 mg | ORAL_TABLET | Freq: Every day | ORAL | Status: DC

## 2014-11-20 MED ORDER — LEVOTHYROXINE SODIUM 25 MCG PO TABS: 25.0000 ug | ORAL_TABLET | Freq: Every day | ORAL | Status: DC

## 2014-11-20 NOTE — Telephone Encounter (Signed)
Rfs sent

## 2014-11-20 NOTE — Telephone Encounter (Signed)
Patient needs refills for levothyroxine (SYNTHROID, LEVOTHROID) 25 MCG tablet [[id number]] and amLODipine (NORVASC) 5 MG tablet [[id number]/ Pharmacy is [facility]

## 2014-12-15 ENCOUNTER — Other Ambulatory Visit (INDEPENDENT_AMBULATORY_CARE_PROVIDER_SITE_OTHER): Payer: Commercial Managed Care - HMO

## 2014-12-15 ENCOUNTER — Ambulatory Visit (INDEPENDENT_AMBULATORY_CARE_PROVIDER_SITE_OTHER): Payer: Commercial Managed Care - HMO | Admitting: Internal Medicine

## 2014-12-15 ENCOUNTER — Encounter: Payer: Self-pay | Admitting: Internal Medicine

## 2014-12-15 VITALS — BP 136/82 | HR 95 | Temp 97.9°F | Resp 14 | Ht 66.0 in | Wt 164.0 lb

## 2014-12-15 DIAGNOSIS — S70261A Insect bite (nonvenomous), right hip, initial encounter: Secondary | ICD-10-CM

## 2014-12-15 DIAGNOSIS — E034 Atrophy of thyroid (acquired): Secondary | ICD-10-CM

## 2014-12-15 DIAGNOSIS — W57XXXA Bitten or stung by nonvenomous insect and other nonvenomous arthropods, initial encounter: Secondary | ICD-10-CM

## 2014-12-15 DIAGNOSIS — E038 Other specified hypothyroidism: Secondary | ICD-10-CM

## 2014-12-15 DIAGNOSIS — F411 Generalized anxiety disorder: Secondary | ICD-10-CM

## 2014-12-15 DIAGNOSIS — L4050 Arthropathic psoriasis, unspecified: Secondary | ICD-10-CM | POA: Diagnosis not present

## 2014-12-15 DIAGNOSIS — I10 Essential (primary) hypertension: Secondary | ICD-10-CM

## 2014-12-15 DIAGNOSIS — M1990 Unspecified osteoarthritis, unspecified site: Secondary | ICD-10-CM

## 2014-12-15 DIAGNOSIS — S70269A Insect bite (nonvenomous), unspecified hip, initial encounter: Secondary | ICD-10-CM | POA: Insufficient documentation

## 2014-12-15 LAB — BASIC METABOLIC PANEL
BUN: 18 mg/dL (ref 6–23)
CO2: 28 mEq/L (ref 19–32)
Calcium: 10.1 mg/dL (ref 8.4–10.5)
Chloride: 102 mEq/L (ref 96–112)
Creatinine, Ser: 0.98 mg/dL (ref 0.40–1.20)
GFR: 58.01 mL/min — ABNORMAL LOW (ref >60.00)
Glucose, Bld: 91 mg/dL (ref 70–99)
Potassium: 3.5 mEq/L (ref 3.5–5.1)
Sodium: 140 mEq/L (ref 135–145)

## 2014-12-15 LAB — CBC WITH DIFFERENTIAL/PLATELET
BASOS PCT: 0.8 % (ref 0.0–3.0)
Basophils Absolute: 0 10*3/uL (ref 0.0–0.1)
Eosinophils Absolute: 0 10*3/uL (ref 0.0–0.7)
Eosinophils Relative: 0.4 % (ref 0.0–5.0)
HEMATOCRIT: 38.9 % (ref 36.0–46.0)
Hemoglobin: 13 g/dL (ref 12.0–15.0)
Lymphocytes Relative: 41 % (ref 12.0–46.0)
Lymphs Abs: 2.3 10*3/uL (ref 0.7–4.0)
MCHC: 33.5 g/dL (ref 30.0–36.0)
MCV: 79.2 fl (ref 78.0–100.0)
Monocytes Absolute: 0.8 10*3/uL (ref 0.1–1.0)
Monocytes Relative: 14.2 % — ABNORMAL HIGH (ref 3.0–12.0)
NEUTROS ABS: 2.5 10*3/uL (ref 1.4–7.7)
Neutrophils Relative %: 43.6 % (ref 43.0–77.0)
Platelets: 61 10*3/uL — ABNORMAL LOW (ref 150.0–400.0)
RBC: 4.92 Mil/uL (ref 3.87–5.11)
RDW: 15.7 % — AB (ref 11.5–15.5)
WBC: 5.7 10*3/uL (ref 4.0–10.5)

## 2014-12-15 LAB — SEDIMENTATION RATE: SED RATE: 18 mm/h (ref 0–22)

## 2014-12-15 LAB — HEPATIC FUNCTION PANEL
ALK PHOS: 59 U/L (ref 39–117)
ALT: 13 U/L (ref 0–35)
AST: 16 U/L (ref 0–37)
Albumin: 4.2 g/dL (ref 3.5–5.2)
BILIRUBIN DIRECT: 0.1 mg/dL (ref 0.0–0.3)
BILIRUBIN TOTAL: 0.5 mg/dL (ref 0.2–1.2)
Total Protein: 8 g/dL (ref 6.0–8.3)

## 2014-12-15 LAB — TSH: TSH: 4.19 u[IU]/mL (ref 0.35–4.50)

## 2014-12-15 MED ORDER — PREDNISONE 5 MG PO TABS
5.0000 mg | ORAL_TABLET | Freq: Every day | ORAL | Status: DC
Start: 2014-12-15 — End: 2015-06-20

## 2014-12-15 MED ORDER — TRIAMCINOLONE ACETONIDE 0.5 % EX CREA: 1.0000 "application " | TOPICAL_CREAM | Freq: Three times a day (TID) | CUTANEOUS | Status: DC

## 2014-12-15 MED ORDER — LORAZEPAM 1 MG PO TABS: 1.0000 mg | ORAL_TABLET | Freq: Two times a day (BID) | ORAL | Status: DC | PRN

## 2014-12-15 MED ORDER — DOXYCYCLINE HYCLATE 100 MG PO TABS: 100.0000 mg | ORAL_TABLET | Freq: Two times a day (BID) | ORAL | Status: DC

## 2014-12-15 NOTE — Assessment & Plan Note (Signed)
Chronic On Lorazepam  Potential benefits of a long term benzodiazepines  use as well as potential risks  and complications were explained to the patient and were aknowledged.

## 2014-12-15 NOTE — Assessment & Plan Note (Signed)
5/16 L thigh - local allergic reaction Kenalog tid Doxy if fever, rash -Rx

## 2014-12-15 NOTE — Assessment & Plan Note (Signed)
Chronic.  Losartan, Norvasc

## 2014-12-15 NOTE — Assessment & Plan Note (Signed)
Prednisone - low dose Tramadol prn

## 2014-12-15 NOTE — Progress Notes (Signed)
   Subjective:  C/o L sided joint pain x 2 mo C/o tick bite to LLE 2 wks ago - she burnt it  Dr Charlestine Night stopped prednisone. She had a L shoulder injection F/u pain in neck, B shoulders and hands x weeks F/u GERD x2 mo, daily, tired Pt is seeing Dr Towanda Octave in [place] (Ortho). Prednisone is helping...  Arthritis Presents for follow-up visit. She reports no joint swelling. The symptoms have been stable. Pertinent negatives include no fatigue, fever or rash.   The patient presents for a follow-up of  chronic hypertension, chronic dyslipidemia    f/u LBP, arthralgias resolved  BP Readings from Last 3 Encounters:  12/15/14 136/92  09/14/14 130/90  08/14/14 180/110   Wt Readings from Last 3 Encounters:  12/15/14 164 lb (74.39 kg)  09/14/14 163 lb (73.936 kg)  08/14/14 164 lb (74.39 kg)      Review of Systems  Constitutional: Negative for fever, chills, activity change, appetite change, fatigue and unexpected weight change.  HENT: Negative for congestion, mouth sores and sinus pressure.   Eyes: Negative for visual disturbance.  Respiratory: Negative for cough and chest tightness.   Gastrointestinal: Negative for nausea and abdominal pain.  Genitourinary: Negative for frequency, difficulty urinating and vaginal pain.  Musculoskeletal: Positive for arthralgias (better) and arthritis. Negative for back pain, joint swelling and gait problem.  Skin: Negative for pallor and rash.  Neurological: Negative for dizziness, tremors and weakness.  Psychiatric/Behavioral: Negative for confusion and sleep disturbance.       Objective:   Physical Exam  Constitutional: She appears well-developed. No distress.  HENT:  Head: Normocephalic.  Right Ear: External ear normal.  Left Ear: External ear normal.  Nose: Nose normal.  Mouth/Throat: Oropharynx is clear and moist.  Eyes: Conjunctivae are normal. Pupils are equal, round, and reactive to light. Right eye exhibits no discharge. Left  eye exhibits no discharge.  Neck: Normal range of motion. Neck supple. No JVD present. No tracheal deviation present. No thyromegaly present.  Cardiovascular: Normal rate, regular rhythm and normal heart sounds.   Pulmonary/Chest: No stridor. No respiratory distress. She has no wheezes.  Abdominal: Soft. Bowel sounds are normal. She exhibits no distension and no mass. There is no tenderness. There is no rebound and no guarding.  Musculoskeletal: She exhibits no edema or tenderness.  Lymphadenopathy:    She has no cervical adenopathy.  Neurological: She displays normal reflexes. No cranial nerve deficit. She exhibits normal muscle tone. Coordination normal.  Skin: No rash noted. No erythema.  Psychiatric: She has a normal mood and affect. Her behavior is normal. Judgment and thought content normal.  2 cm tick bite firm erythema L wrist, shoulder, hip, knee - tender w/ROM  Lab Results  Component Value Date   WBC 5.2 08/16/2014   HGB 12.0 08/16/2014   HCT 37.3 08/16/2014   PLT 71.0* 08/16/2014   GLUCOSE 87 08/16/2014   CHOL 228* 08/16/2014   TRIG 84.0 08/16/2014   HDL 38.10* 08/16/2014   LDLDIRECT 172.4 12/04/2010   LDLCALC 173* 08/16/2014   ALT 20 08/16/2014   AST 25 08/16/2014   NA 142 08/16/2014   K 3.3* 08/16/2014   CL 107 08/16/2014   CREATININE 0.8 08/16/2014   BUN 16 08/16/2014   CO2 29 08/16/2014   TSH 2.80 08/16/2014   INR 1.0 ratio 12/10/2009   HGBA1C 5.7 05/28/2010      Assessment & Plan:

## 2014-12-15 NOTE — Assessment & Plan Note (Signed)
Chronic On Levothroid - 

## 2014-12-15 NOTE — Progress Notes (Signed)
Pre visit review using our clinic review tool, if applicable. No additional management support is needed unless otherwise documented below in the visit note. 

## 2015-01-01 ENCOUNTER — Other Ambulatory Visit: Payer: Self-pay | Admitting: Internal Medicine

## 2015-01-01 NOTE — Telephone Encounter (Signed)
Pt called in said that area is still itching and she would like a refill on her doxycycline (VIBRA-TABS) 100 MG tablet [[id number]]   [facility]  [phone]

## 2015-01-02 DIAGNOSIS — H5203 Hypermetropia, bilateral: Secondary | ICD-10-CM | POA: Diagnosis not present

## 2015-01-02 DIAGNOSIS — H5210 Myopia, unspecified eye: Secondary | ICD-10-CM | POA: Diagnosis not present

## 2015-01-02 DIAGNOSIS — H524 Presbyopia: Secondary | ICD-10-CM | POA: Diagnosis not present

## 2015-01-02 DIAGNOSIS — Z0100 Encounter for examination of eyes and vision without abnormal findings: Secondary | ICD-10-CM | POA: Diagnosis not present

## 2015-01-02 DIAGNOSIS — H52209 Unspecified astigmatism, unspecified eye: Secondary | ICD-10-CM | POA: Diagnosis not present

## 2015-01-02 MED ORDER — DOXYCYCLINE HYCLATE 100 MG PO TABS: 100.0000 mg | ORAL_TABLET | Freq: Two times a day (BID) | ORAL | Status: DC

## 2015-01-02 NOTE — Telephone Encounter (Signed)
Rf sent.  Called pt to inform of below. No answer/unable to leave vm. Will try again later.

## 2015-01-02 NOTE — Telephone Encounter (Signed)
OK Doxy if she did not take it x10 d starting on 5/6. Use cream - it will take a while for rash to get better... Thx

## 2015-01-02 NOTE — Telephone Encounter (Signed)
Pt informed

## 2015-01-09 ENCOUNTER — Encounter: Payer: Self-pay | Admitting: Internal Medicine

## 2015-01-09 DIAGNOSIS — Z1231 Encounter for screening mammogram for malignant neoplasm of breast: Secondary | ICD-10-CM | POA: Diagnosis not present

## 2015-01-09 LAB — HM MAMMOGRAPHY

## 2015-01-22 ENCOUNTER — Encounter: Payer: Self-pay | Admitting: Internal Medicine

## 2015-01-22 ENCOUNTER — Telehealth: Payer: Self-pay | Admitting: Internal Medicine

## 2015-01-22 DIAGNOSIS — L4050 Arthropathic psoriasis, unspecified: Secondary | ICD-10-CM

## 2015-01-22 NOTE — Telephone Encounter (Signed)
Patient requesting a referral to go see Dr. Charlestine Night for arthritis.

## 2015-01-22 NOTE — Telephone Encounter (Signed)
Ok Thx 

## 2015-01-30 DIAGNOSIS — M19042 Primary osteoarthritis, left hand: Secondary | ICD-10-CM | POA: Diagnosis not present

## 2015-01-30 DIAGNOSIS — L4050 Arthropathic psoriasis, unspecified: Secondary | ICD-10-CM | POA: Diagnosis not present

## 2015-01-30 DIAGNOSIS — M25562 Pain in left knee: Secondary | ICD-10-CM | POA: Diagnosis not present

## 2015-01-30 DIAGNOSIS — M19041 Primary osteoarthritis, right hand: Secondary | ICD-10-CM | POA: Diagnosis not present

## 2015-01-30 DIAGNOSIS — M545 Low back pain: Secondary | ICD-10-CM | POA: Diagnosis not present

## 2015-02-28 DIAGNOSIS — M2392 Unspecified internal derangement of left knee: Secondary | ICD-10-CM | POA: Diagnosis not present

## 2015-02-28 DIAGNOSIS — M542 Cervicalgia: Secondary | ICD-10-CM | POA: Diagnosis not present

## 2015-02-28 DIAGNOSIS — L409 Psoriasis, unspecified: Secondary | ICD-10-CM | POA: Diagnosis not present

## 2015-03-20 ENCOUNTER — Ambulatory Visit (INDEPENDENT_AMBULATORY_CARE_PROVIDER_SITE_OTHER)
Admission: RE | Admit: 2015-03-20 | Discharge: 2015-03-20 | Disposition: A | Discharge status: Home / Self Care | Payer: Commercial Managed Care - HMO | Source: Ambulatory Visit | Attending: Internal Medicine | Admitting: Internal Medicine

## 2015-03-20 ENCOUNTER — Ambulatory Visit (INDEPENDENT_AMBULATORY_CARE_PROVIDER_SITE_OTHER): Payer: Commercial Managed Care - HMO | Admitting: Internal Medicine

## 2015-03-20 ENCOUNTER — Encounter: Payer: Self-pay | Admitting: Internal Medicine

## 2015-03-20 VITALS — BP 135/80 | HR 72 | Wt 164.0 lb

## 2015-03-20 DIAGNOSIS — Z23 Encounter for immunization: Secondary | ICD-10-CM

## 2015-03-20 DIAGNOSIS — F411 Generalized anxiety disorder: Secondary | ICD-10-CM

## 2015-03-20 DIAGNOSIS — S70262D Insect bite (nonvenomous), left hip, subsequent encounter: Secondary | ICD-10-CM | POA: Diagnosis not present

## 2015-03-20 DIAGNOSIS — M542 Cervicalgia: Secondary | ICD-10-CM

## 2015-03-20 DIAGNOSIS — M5032 Other cervical disc degeneration, mid-cervical region: Secondary | ICD-10-CM | POA: Diagnosis not present

## 2015-03-20 DIAGNOSIS — W57XXXD Bitten or stung by nonvenomous insect and other nonvenomous arthropods, subsequent encounter: Secondary | ICD-10-CM | POA: Diagnosis not present

## 2015-03-20 DIAGNOSIS — M47812 Spondylosis without myelopathy or radiculopathy, cervical region: Secondary | ICD-10-CM | POA: Diagnosis not present

## 2015-03-20 DIAGNOSIS — L409 Psoriasis, unspecified: Secondary | ICD-10-CM | POA: Diagnosis not present

## 2015-03-20 MED ORDER — TRIAMCINOLONE ACETONIDE 0.5 % EX CREA: 1.0000 "application " | TOPICAL_CREAM | Freq: Two times a day (BID) | CUTANEOUS | Status: DC

## 2015-03-20 NOTE — Progress Notes (Signed)
Pre visit review using our clinic review tool, if applicable. No additional management support is needed unless otherwise documented below in the visit note. 

## 2015-03-20 NOTE — Assessment & Plan Note (Signed)
Still having a reaction Derm appt 04/18/15

## 2015-03-20 NOTE — Assessment & Plan Note (Signed)
OA vs psoriatic arthritis

## 2015-03-20 NOTE — Assessment & Plan Note (Signed)
Triamc oint 

## 2015-03-20 NOTE — Progress Notes (Signed)
Subjective:  Patient ID: [name] [name], female    DOB: 1933/07/03  Age: 81 y.o. MRN: [id number]  CC: No chief complaint on file.   HPI [name] [name] presents for HTN, overactive bladder, neck pain f/u - bad  Outpatient Prescriptions Prior to Visit  Medication Sig Dispense Refill  . amLODipine (NORVASC) 5 MG tablet Take 1 tablet (5 mg total) by mouth daily. 90 tablet 3  . aspirin 81 MG tablet Take 81 mg by mouth daily.      . Cholecalciferol (VITAMIN D3) 1000 UNITS tablet Take 1,000 Units by mouth daily.      . clobetasol ointment (TEMOVATE) 0.05 % Apply topically 2 (two) times daily. 90 g 2  . doxycycline (VIBRA-TABS) 100 MG tablet Take 1 tablet (100 mg total) by mouth 2 (two) times daily. 20 tablet 0  . doxycycline (VIBRA-TABS) 100 MG tablet Take 1 tablet (100 mg total) by mouth 2 (two) times daily. 20 tablet 0  . fesoterodine (TOVIAZ) 8 MG TB24 Take 1 tablet (8 mg total) by mouth daily. 90 tablet 2  . levothyroxine (SYNTHROID, LEVOTHROID) 25 MCG tablet Take 1 tablet (25 mcg total) by mouth daily. 90 tablet 3  . LORazepam (ATIVAN) 1 MG tablet Take 1 tablet (1 mg total) by mouth 2 (two) times daily as needed for anxiety. 60 tablet 5  . losartan (COZAAR) 100 MG tablet Take 1 tablet (100 mg total) by mouth daily. 90 tablet 3  . mirabegron ER (MYRBETRIQ) 50 MG TB24 tablet Take 1 tablet (50 mg total) by mouth daily. 30 tablet 11  . potassium chloride (K-DUR) 10 MEQ tablet Take 1 tablet (10 mEq total) by mouth 2 (two) times daily. 60 tablet 2  . predniSONE (DELTASONE) 5 MG tablet Take 1 tablet (5 mg total) by mouth daily with breakfast. 100 tablet 1  . ranitidine (ZANTAC) 150 MG tablet Take 1 tablet (150 mg total) by mouth 2 (two) times daily. 180 tablet 3  . traMADol (ULTRAM) 50 MG tablet Take 1-2 tablets (50-100 mg total) by mouth 2 (two) times daily as needed. 100 tablet 3  . triamcinolone cream (KENALOG) 0.5 % Apply 1 application topically 3 (three) times daily. 15 g 0    Facility-Administered Medications Prior to Visit  Medication Dose Route Frequency Provider Last Rate Last Dose  . methylPREDNISolone acetate (DEPO-MEDROL) injection 10.4 mg  10.4 mg Intra-articular Once Dalena Plantz V, MD        ROS Review of Systems  Constitutional: Negative for chills, activity change, appetite change, fatigue and unexpected weight change.  HENT: Negative for congestion, mouth sores, sinus pressure and voice change.   Eyes: Negative for visual disturbance.  Respiratory: Negative for cough and chest tightness.   Gastrointestinal: Negative for nausea and abdominal pain.  Genitourinary: Negative for frequency, difficulty urinating and vaginal pain.  Musculoskeletal: Positive for arthralgias, neck pain and neck stiffness. Negative for back pain and gait problem.  Skin: Negative for pallor and rash.  Neurological: Negative for dizziness, tremors, weakness, numbness and headaches.  Psychiatric/Behavioral: Negative for suicidal ideas, confusion and sleep disturbance. The patient is not nervous/anxious.     Objective:  BP 135/80 mmHg  Pulse 72  Wt 164 lb (74.39 kg)  SpO2 97%  BP Readings from Last 3 Encounters:  03/20/15 135/80  12/15/14 136/82  09/14/14 130/90    Wt Readings from Last 3 Encounters:  03/20/15 164 lb (74.39 kg)  12/15/14 164 lb (74.39 kg)  09/14/14 163 lb (73.936 kg)  Physical Exam  Constitutional: She appears well-developed. No distress.  HENT:  Head: Normocephalic.  Right Ear: External ear normal.  Left Ear: External ear normal.  Nose: Nose normal.  Mouth/Throat: Oropharynx is clear and moist.  Eyes: Conjunctivae are normal. Pupils are equal, round, and reactive to light. Right eye exhibits no discharge. Left eye exhibits no discharge.  Neck: Normal range of motion. Neck supple. No JVD present. No tracheal deviation present. No thyromegaly present.  Cardiovascular: Normal rate, regular rhythm and normal heart sounds.    Pulmonary/Chest: No stridor. No respiratory distress. She has no wheezes.  Abdominal: Soft. Bowel sounds are normal. She exhibits no distension and no mass. There is no tenderness. There is no rebound and no guarding.  Musculoskeletal: She exhibits no edema or tenderness.  Lymphadenopathy:    She has no cervical adenopathy.  Neurological: She displays normal reflexes. No cranial nerve deficit. She exhibits normal muscle tone. Coordination normal.  Skin: No rash noted. No erythema.  Psychiatric: She has a normal mood and affect. Her behavior is normal. Judgment and thought content normal.  L thigh papule 6 mm Neck - tender and stiff  Lab Results  Component Value Date   WBC 5.7 12/15/2014   HGB 13.0 12/15/2014   HCT 38.9 12/15/2014   PLT 61.0* 12/15/2014   GLUCOSE 91 12/15/2014   CHOL 228* 08/16/2014   TRIG 84.0 08/16/2014   HDL 38.10* 08/16/2014   LDLDIRECT 172.4 12/04/2010   LDLCALC 173* 08/16/2014   ALT 13 12/15/2014   AST 16 12/15/2014   NA 140 12/15/2014   K 3.5 12/15/2014   CL 102 12/15/2014   CREATININE 0.98 12/15/2014   BUN 18 12/15/2014   CO2 28 12/15/2014   TSH 4.19 12/15/2014   INR 1.0 ratio 12/10/2009   HGBA1C 5.7 05/28/2010    Dg Cervical Spine Complete  09/28/2013   CLINICAL DATA:  Bilateral neck pain for 1 month, no known injury  EXAM: CERVICAL SPINE  4+ VIEWS  COMPARISON:  None.  FINDINGS: C1 to the superior endplate of T1 is imaged on the provided lateral radiograph.  There is minimal (1-2 mm) of anterolisthesis of C4 upon C5. Otherwise, normal alignment of the cervical spine. The bilateral facets appear normally aligned. The dens is normally positioned with the lateral masses of C1.  Cervical vertebral body heights are preserved. Prevertebral soft tissues are normal.  There is moderate to severe multilevel cervical spine DDD, likely worse at C5-C6 and to a lesser extent, C4-C5 and C6-C7 with disc space height loss, endplate irregularity and sclerosis.  The  bilateral neural foramina appear patent given obliquity.  Atherosclerotic plaque overlies the expected location of the left carotid bulb.  Limited visualization of the lung apices demonstrates slightly asymmetric biapical pleural parenchymal thickening, left greater than right. Atherosclerotic plaque within the thoracic aorta.  IMPRESSION: 1. No acute findings. 2. Moderate severe multilevel cervical spine DDD, likely worse at C5-C6.   Electronically Signed   By: Sandi Mariscal M.D.   On: 09/28/2013 11:04    Assessment & Plan:   Diagnoses and all orders for this visit:  Neck pain, bilateral Orders: -     DG Cervical Spine 2 or 3 views; Future  Tick bite of hip, left, subsequent encounter  Psoriasis  Anxiety state  Neck pain  Other orders -     triamcinolone cream (KENALOG) 0.5 %; Apply 1 application topically 2 (two) times daily.   I have changed Ms. Carrero's triamcinolone cream. I am also  having her maintain her aspirin, cholecalciferol, fesoterodine, potassium chloride, clobetasol ointment, ranitidine, traMADol, mirabegron ER, losartan, amLODipine, levothyroxine, LORazepam, doxycycline, predniSONE, and doxycycline. We will continue to administer methylPREDNISolone acetate.  Meds ordered this encounter  Medications  . triamcinolone cream (KENALOG) 0.5 %    Sig: Apply 1 application topically 2 (two) times daily.    Dispense:  45 g    Refill:  0     Follow-up: Return in about 3 months (around 06/20/2015) for a follow-up visit.  Walker Kehr, MD

## 2015-03-20 NOTE — Assessment & Plan Note (Signed)
On Lorazepam  Potential benefits of a long term benzodiazepines  use as well as potential risks  and complications were explained to the patient and were aknowledged.

## 2015-04-04 DIAGNOSIS — M19042 Primary osteoarthritis, left hand: Secondary | ICD-10-CM | POA: Diagnosis not present

## 2015-04-04 DIAGNOSIS — M19041 Primary osteoarthritis, right hand: Secondary | ICD-10-CM | POA: Diagnosis not present

## 2015-04-04 DIAGNOSIS — M542 Cervicalgia: Secondary | ICD-10-CM | POA: Diagnosis not present

## 2015-04-04 DIAGNOSIS — M25562 Pain in left knee: Secondary | ICD-10-CM | POA: Diagnosis not present

## 2015-04-23 DIAGNOSIS — C44311 Basal cell carcinoma of skin of nose: Secondary | ICD-10-CM | POA: Diagnosis not present

## 2015-04-23 DIAGNOSIS — L570 Actinic keratosis: Secondary | ICD-10-CM | POA: Diagnosis not present

## 2015-04-23 DIAGNOSIS — L219 Seborrheic dermatitis, unspecified: Secondary | ICD-10-CM | POA: Diagnosis not present

## 2015-04-30 DIAGNOSIS — F4321 Adjustment disorder with depressed mood: Secondary | ICD-10-CM | POA: Diagnosis not present

## 2015-05-10 DIAGNOSIS — R262 Difficulty in walking, not elsewhere classified: Secondary | ICD-10-CM | POA: Diagnosis not present

## 2015-05-10 DIAGNOSIS — M170 Bilateral primary osteoarthritis of knee: Secondary | ICD-10-CM | POA: Diagnosis not present

## 2015-05-18 DIAGNOSIS — M25561 Pain in right knee: Secondary | ICD-10-CM | POA: Diagnosis not present

## 2015-05-18 DIAGNOSIS — M170 Bilateral primary osteoarthritis of knee: Secondary | ICD-10-CM | POA: Diagnosis not present

## 2015-05-18 DIAGNOSIS — M1712 Unilateral primary osteoarthritis, left knee: Secondary | ICD-10-CM | POA: Diagnosis not present

## 2015-05-18 DIAGNOSIS — M25562 Pain in left knee: Secondary | ICD-10-CM | POA: Diagnosis not present

## 2015-05-25 DIAGNOSIS — M25562 Pain in left knee: Secondary | ICD-10-CM | POA: Diagnosis not present

## 2015-05-25 DIAGNOSIS — M1712 Unilateral primary osteoarthritis, left knee: Secondary | ICD-10-CM | POA: Diagnosis not present

## 2015-05-30 DIAGNOSIS — M25562 Pain in left knee: Secondary | ICD-10-CM | POA: Diagnosis not present

## 2015-05-30 DIAGNOSIS — M1712 Unilateral primary osteoarthritis, left knee: Secondary | ICD-10-CM | POA: Diagnosis not present

## 2015-06-06 DIAGNOSIS — M25562 Pain in left knee: Secondary | ICD-10-CM | POA: Diagnosis not present

## 2015-06-06 DIAGNOSIS — M1712 Unilateral primary osteoarthritis, left knee: Secondary | ICD-10-CM | POA: Diagnosis not present

## 2015-06-13 DIAGNOSIS — M1712 Unilateral primary osteoarthritis, left knee: Secondary | ICD-10-CM | POA: Diagnosis not present

## 2015-06-13 DIAGNOSIS — M25562 Pain in left knee: Secondary | ICD-10-CM | POA: Diagnosis not present

## 2015-06-20 ENCOUNTER — Ambulatory Visit (INDEPENDENT_AMBULATORY_CARE_PROVIDER_SITE_OTHER): Payer: Commercial Managed Care - HMO | Admitting: Internal Medicine

## 2015-06-20 ENCOUNTER — Encounter: Payer: Self-pay | Admitting: Internal Medicine

## 2015-06-20 VITALS — BP 142/96 | HR 88 | Wt 162.0 lb

## 2015-06-20 DIAGNOSIS — I10 Essential (primary) hypertension: Secondary | ICD-10-CM | POA: Diagnosis not present

## 2015-06-20 DIAGNOSIS — E038 Other specified hypothyroidism: Secondary | ICD-10-CM | POA: Diagnosis not present

## 2015-06-20 DIAGNOSIS — L4050 Arthropathic psoriasis, unspecified: Secondary | ICD-10-CM

## 2015-06-20 DIAGNOSIS — E785 Hyperlipidemia, unspecified: Secondary | ICD-10-CM | POA: Diagnosis not present

## 2015-06-20 DIAGNOSIS — E034 Atrophy of thyroid (acquired): Secondary | ICD-10-CM

## 2015-06-20 DIAGNOSIS — M5442 Lumbago with sciatica, left side: Secondary | ICD-10-CM

## 2015-06-20 DIAGNOSIS — G8929 Other chronic pain: Secondary | ICD-10-CM

## 2015-06-20 DIAGNOSIS — M5441 Lumbago with sciatica, right side: Secondary | ICD-10-CM

## 2015-06-20 NOTE — Assessment & Plan Note (Signed)
No statins

## 2015-06-20 NOTE — Assessment & Plan Note (Signed)
On Levothroid 

## 2015-06-20 NOTE — Progress Notes (Signed)
Pre visit review using our clinic review tool, if applicable. No additional management support is needed unless otherwise documented below in the visit note. 

## 2015-06-20 NOTE — Assessment & Plan Note (Signed)
Chronic  Losartan, Norvasc 

## 2015-06-20 NOTE — Assessment & Plan Note (Addendum)
Doing ok 2016: off Prednisone and off Tramadol

## 2015-06-20 NOTE — Assessment & Plan Note (Addendum)
Relapsing - ok now

## 2015-06-20 NOTE — Progress Notes (Signed)
Subjective:  Patient ID: [name] [name], female    DOB: 01-Mar-1933  Age: 81 y.o. MRN: [id number]  CC: No chief complaint on file.   HPI ANTORIA LANZA presents for HTN,anxiety, hypothyroidism f/u  Outpatient Prescriptions Prior to Visit  Medication Sig Dispense Refill  . amLODipine (NORVASC) 5 MG tablet Take 1 tablet (5 mg total) by mouth daily. 90 tablet 3  . aspirin 81 MG tablet Take 81 mg by mouth daily.      . Cholecalciferol (VITAMIN D3) 1000 UNITS tablet Take 1,000 Units by mouth daily.      Marland Kitchen doxycycline (VIBRA-TABS) 100 MG tablet Take 1 tablet (100 mg total) by mouth 2 (two) times daily. 20 tablet 0  . doxycycline (VIBRA-TABS) 100 MG tablet Take 1 tablet (100 mg total) by mouth 2 (two) times daily. 20 tablet 0  . fesoterodine (TOVIAZ) 8 MG TB24 Take 1 tablet (8 mg total) by mouth daily. 90 tablet 2  . levothyroxine (SYNTHROID, LEVOTHROID) 25 MCG tablet Take 1 tablet (25 mcg total) by mouth daily. 90 tablet 3  . LORazepam (ATIVAN) 1 MG tablet Take 1 tablet (1 mg total) by mouth 2 (two) times daily as needed for anxiety. 60 tablet 5  . losartan (COZAAR) 100 MG tablet Take 1 tablet (100 mg total) by mouth daily. 90 tablet 3  . mirabegron ER (MYRBETRIQ) 50 MG TB24 tablet Take 1 tablet (50 mg total) by mouth daily. 30 tablet 11  . potassium chloride (K-DUR) 10 MEQ tablet Take 1 tablet (10 mEq total) by mouth 2 (two) times daily. 60 tablet 2  . predniSONE (DELTASONE) 5 MG tablet Take 1 tablet (5 mg total) by mouth daily with breakfast. 100 tablet 1  . ranitidine (ZANTAC) 150 MG tablet Take 1 tablet (150 mg total) by mouth 2 (two) times daily. 180 tablet 3  . traMADol (ULTRAM) 50 MG tablet Take 1-2 tablets (50-100 mg total) by mouth 2 (two) times daily as needed. 100 tablet 3  . triamcinolone cream (KENALOG) 0.5 % Apply 1 application topically 2 (two) times daily. 45 g 0  . clobetasol ointment (TEMOVATE) 0.05 % Apply topically 2 (two) times daily. 90 g 2   Facility-Administered  Medications Prior to Visit  Medication Dose Route Frequency Provider Last Rate Last Dose  . methylPREDNISolone acetate (DEPO-MEDROL) injection 10.4 mg  10.4 mg Intra-articular Once Eloise Mula V, MD        ROS Review of Systems  Constitutional: Negative for chills, activity change, appetite change, fatigue and unexpected weight change.  HENT: Negative for congestion, mouth sores and sinus pressure.   Eyes: Negative for visual disturbance.  Respiratory: Negative for cough and chest tightness.   Gastrointestinal: Negative for nausea and abdominal pain.  Genitourinary: Negative for frequency, difficulty urinating and vaginal pain.  Musculoskeletal: Negative for back pain and gait problem.  Skin: Negative for pallor and rash.  Neurological: Negative for dizziness, tremors, weakness, numbness and headaches.  Psychiatric/Behavioral: Negative for suicidal ideas, confusion and sleep disturbance.    Objective:  BP 142/96 mmHg  Pulse 88  Wt 162 lb (73.483 kg)  SpO2 99%  BP Readings from Last 3 Encounters:  06/20/15 142/96  03/20/15 135/80  12/15/14 136/82    Wt Readings from Last 3 Encounters:  06/20/15 162 lb (73.483 kg)  03/20/15 164 lb (74.39 kg)  12/15/14 164 lb (74.39 kg)    Physical Exam  Constitutional: She appears well-developed. No distress.  HENT:  Head: Normocephalic.  Right Ear: External ear  normal.  Left Ear: External ear normal.  Nose: Nose normal.  Mouth/Throat: Oropharynx is clear and moist.  Eyes: Conjunctivae are normal. Pupils are equal, round, and reactive to light. Right eye exhibits no discharge. Left eye exhibits no discharge.  Neck: Normal range of motion. Neck supple. No JVD present. No tracheal deviation present. No thyromegaly present.  Cardiovascular: Normal rate, regular rhythm and normal heart sounds.   Pulmonary/Chest: No stridor. No respiratory distress. She has no wheezes. She has no rales.  Abdominal: Soft. Bowel sounds are normal. She  exhibits no distension and no mass. There is no tenderness. There is no rebound and no guarding.  Musculoskeletal: She exhibits no edema or tenderness.  Lymphadenopathy:    She has no cervical adenopathy.  Neurological: She displays normal reflexes. No cranial nerve deficit. She exhibits normal muscle tone. Coordination normal.  Skin: No rash noted. No erythema.  Psychiatric: She has a normal mood and affect. Her behavior is normal. Judgment and thought content normal.    Lab Results  Component Value Date   WBC 5.7 12/15/2014   HGB 13.0 12/15/2014   HCT 38.9 12/15/2014   PLT 61.0* 12/15/2014   GLUCOSE 91 12/15/2014   CHOL 228* 08/16/2014   TRIG 84.0 08/16/2014   HDL 38.10* 08/16/2014   LDLDIRECT 172.4 12/04/2010   LDLCALC 173* 08/16/2014   ALT 13 12/15/2014   AST 16 12/15/2014   NA 140 12/15/2014   K 3.5 12/15/2014   CL 102 12/15/2014   CREATININE 0.98 12/15/2014   BUN 18 12/15/2014   CO2 28 12/15/2014   TSH 4.19 12/15/2014   INR 1.0 ratio 12/10/2009   HGBA1C 5.7 05/28/2010    Dg Cervical Spine 2 Or 3 Views  03/20/2015  CLINICAL DATA:  Neck stiffness. No known injury. Initial evaluation. EXAM: CERVICAL SPINE - 2-3 VIEW COMPARISON:  09/28/2013 FINDINGS: Multilevel severe degenerative change noted cervical spine with particular prominent disc space loss and endplate osteophyte formation noted C5-C6. Straightening of the cervical spine noted consistent with degenerative change. Similar findings noted on prior exam. No acute bony abnormality identified. No evidence of fracture dislocation. Biapical pleural thickening again noted consistent scarring. Carotid vascular calcification noted consistent with atherosclerotic vascular disease. IMPRESSION: 1. Stable degenerative changes of the cervical spine with particular prominent disc space degeneration and endplate osteophyte formation noted C5-C6. No acute bony abnormality. 2. Carotid atherosclerotic vascular disease. Electronically Signed    By: Marcello Moores  Register   On: 03/20/2015 08:44    Assessment & Plan:   There are no diagnoses linked to this encounter. I am having Ms. Northup maintain her aspirin, cholecalciferol, fesoterodine, potassium chloride, clobetasol ointment, ranitidine, traMADol, mirabegron ER, losartan, amLODipine, levothyroxine, LORazepam, doxycycline, predniSONE, doxycycline, triamcinolone cream, nystatin cream, and triamcinolone cream. We will continue to administer methylPREDNISolone acetate.  Meds ordered this encounter  Medications  . nystatin cream (MYCOSTATIN)    Sig: as needed.  . triamcinolone cream (KENALOG) 0.1 %    Sig: as needed.     Follow-up: No Follow-up on file.  Walker Kehr, MD

## 2015-08-01 DIAGNOSIS — K045 Chronic apical periodontitis: Secondary | ICD-10-CM | POA: Diagnosis not present

## 2015-08-15 ENCOUNTER — Other Ambulatory Visit: Payer: Self-pay | Admitting: Internal Medicine

## 2015-09-12 ENCOUNTER — Encounter: Payer: Self-pay | Admitting: Gastroenterology

## 2015-10-02 DIAGNOSIS — M25562 Pain in left knee: Secondary | ICD-10-CM | POA: Diagnosis not present

## 2015-10-02 DIAGNOSIS — M1712 Unilateral primary osteoarthritis, left knee: Secondary | ICD-10-CM | POA: Diagnosis not present

## 2015-10-02 DIAGNOSIS — R262 Difficulty in walking, not elsewhere classified: Secondary | ICD-10-CM | POA: Diagnosis not present

## 2015-10-18 NOTE — Progress Notes (Signed)
Medical screening examination/treatment/procedure(s) were performed by non-physician practitioner and as supervising physician I was immediately available for consultation/collaboration. I agree with above. Elizabeth A Crawford, MD 

## 2015-10-18 NOTE — Progress Notes (Signed)
   Subjective:   [name] [name] is a 82 y.o. female who presents for Medicare Annual Welness exam but had to postpone.   Review of Systems:   This patient in today for AWV;  Stated she had an episode of "dizziness" x 2 in the last 2 weeks; but stated he has "had these all of her life". The patient then digressed into talking about her spouse of whom she is having marital issues. Was tearful and let the patient ventilate but could not re-direct to the assessment planned for today.  Stated she had seen Dr. Casimiro Needle last fall. He had encouraged her to fup with one of his counselor's to process her feelings and make decisions but she did not follow up. Agreed that she needs to and plan to do so now.   BP was elevated to 180/100 at admission; Prior to leaving had dropped to 160/86;  Agreed she needed help to determine what she needed to do to feel better; Agreed to follow up with mental health.  Denied any SI and states she really has a good life, she dealing with past family issues and problems with spouse.   Offered to get the patient an apt with a doctor today regarding dizziness other, but declines, stating she has dealt with this before;  Agrees to fup if this keeps occuring, as educated dizziness can come from many different issues as  carotids; irregular heart beat etc but the patient did not think so. She states she does not pass out, she feels dizzy and lies down and feels better; Transient in nature and has happened a couple times in the last couple of weeks/  Discussed her BP and ongoing stress and agreed to call Dr. Casimiro Needle.  Agreed to make fup apt with Dr. Alain Marion for CPE after May 6th and will re-schedule AWV at that time.   Will fup in April to check on Apt.   This patient service was not billable; The patient was referred to psych for emotional issues. Wellness was postponed   Wynetta Fines RN           This encounter was created in error - please disregard.

## 2015-11-19 ENCOUNTER — Telehealth: Payer: Self-pay

## 2015-11-19 NOTE — Telephone Encounter (Signed)
  This patient in today for AWV; late entry for 10/18/2015 Stated she had an episode of "dizziness" x 2 in the last 2 weeks; but stated he has "had these all of her life". The patient then digressed into talking about her spouse of whom she is having marital issues. Was tearful and let the patient ventilate but could not re-direct to the assessment planned for today.  Stated she had seen Dr. Casimiro Needle last fall. He had encouraged her to fup with one of his counselor's to process her feelings and make decisions but she did not follow up. Agreed that she needs to and plan to do so now.   BP was elevated to 180/100 at admission; Prior to leaving had dropped to 160/86;  Agreed she needed help to determine what she needed to do to feel better; Agreed to follow up with mental health.  Denied any SI and states she really has a good life, she dealing with past family issues and problems with spouse.   Offered to get the patient an apt with a doctor today regarding dizziness other, but declines, stating she has dealt with this before;  Agrees to fup if this keeps occuring, as educated dizziness can come from many different issues as  carotids; irregular heart beat etc but the patient did not think so. She states she does not pass out, she feels dizzy and lies down and feels better; Transient in nature and has happened a couple times in the last couple of weeks/  Discussed her BP and ongoing stress and agreed to call Dr. Casimiro Needle.  Agreed to make fup apt with Dr. Alain Marion for CPE after May 6th and will re-schedule AWV at that time.   Will fup in April to check on Apt.   This patient service was not billable; The patient was referred to psych for emotional issues. Wellness was postponed and closed as an erroneous encounter  Wynetta Fines RN

## 2016-01-04 DIAGNOSIS — E039 Hypothyroidism, unspecified: Secondary | ICD-10-CM | POA: Diagnosis not present

## 2016-01-04 DIAGNOSIS — D509 Iron deficiency anemia, unspecified: Secondary | ICD-10-CM | POA: Diagnosis not present

## 2016-01-04 DIAGNOSIS — D696 Thrombocytopenia, unspecified: Secondary | ICD-10-CM | POA: Diagnosis not present

## 2016-01-23 ENCOUNTER — Telehealth: Payer: Self-pay

## 2016-01-23 NOTE — Telephone Encounter (Signed)
Call to Ms. Crenshaw; spoke to Mr. Eisenstein as wife was outside; Was fup on apt with Plotnikov and stated she is seeing a local doctor; Dr. Garlon Hatchet in [place] and went to an apt yesterday. PCP noted in case. Will close

## 2016-02-20 IMAGING — CR DG CERVICAL SPINE 2 OR 3 VIEWS
3 series · 3 of 3 positions shown · non-contrast
Comparison: 09/28/2013

CLINICAL DATA: Neck stiffness. No known injury. Initial evaluation.

EXAM:
CERVICAL SPINE - 2-3 VIEW

[view not recorded (1 of 3)]
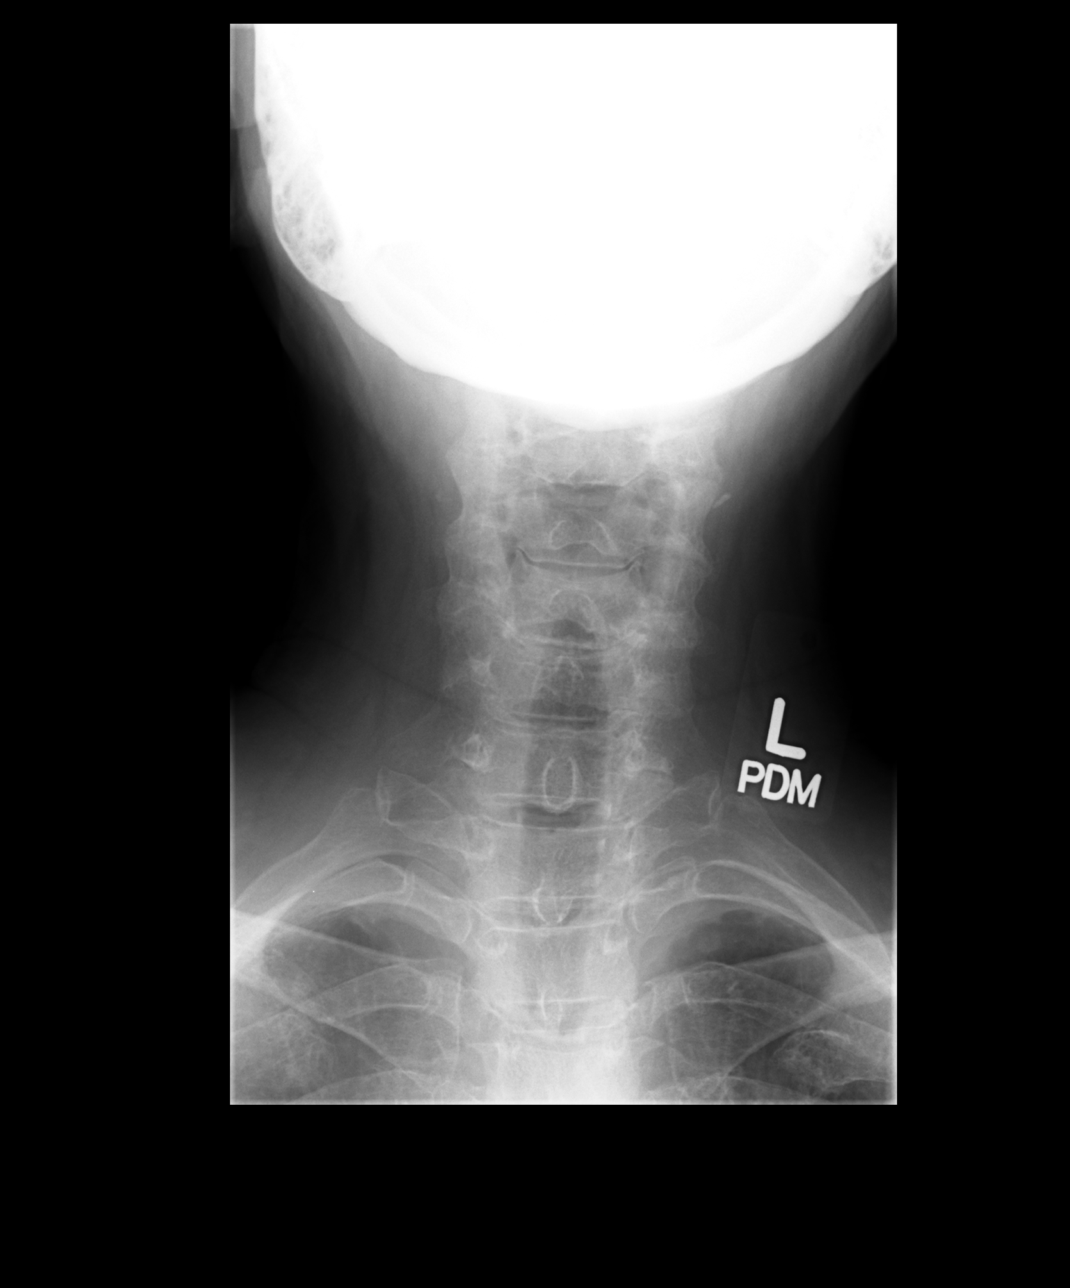

[view not recorded (2 of 3)]
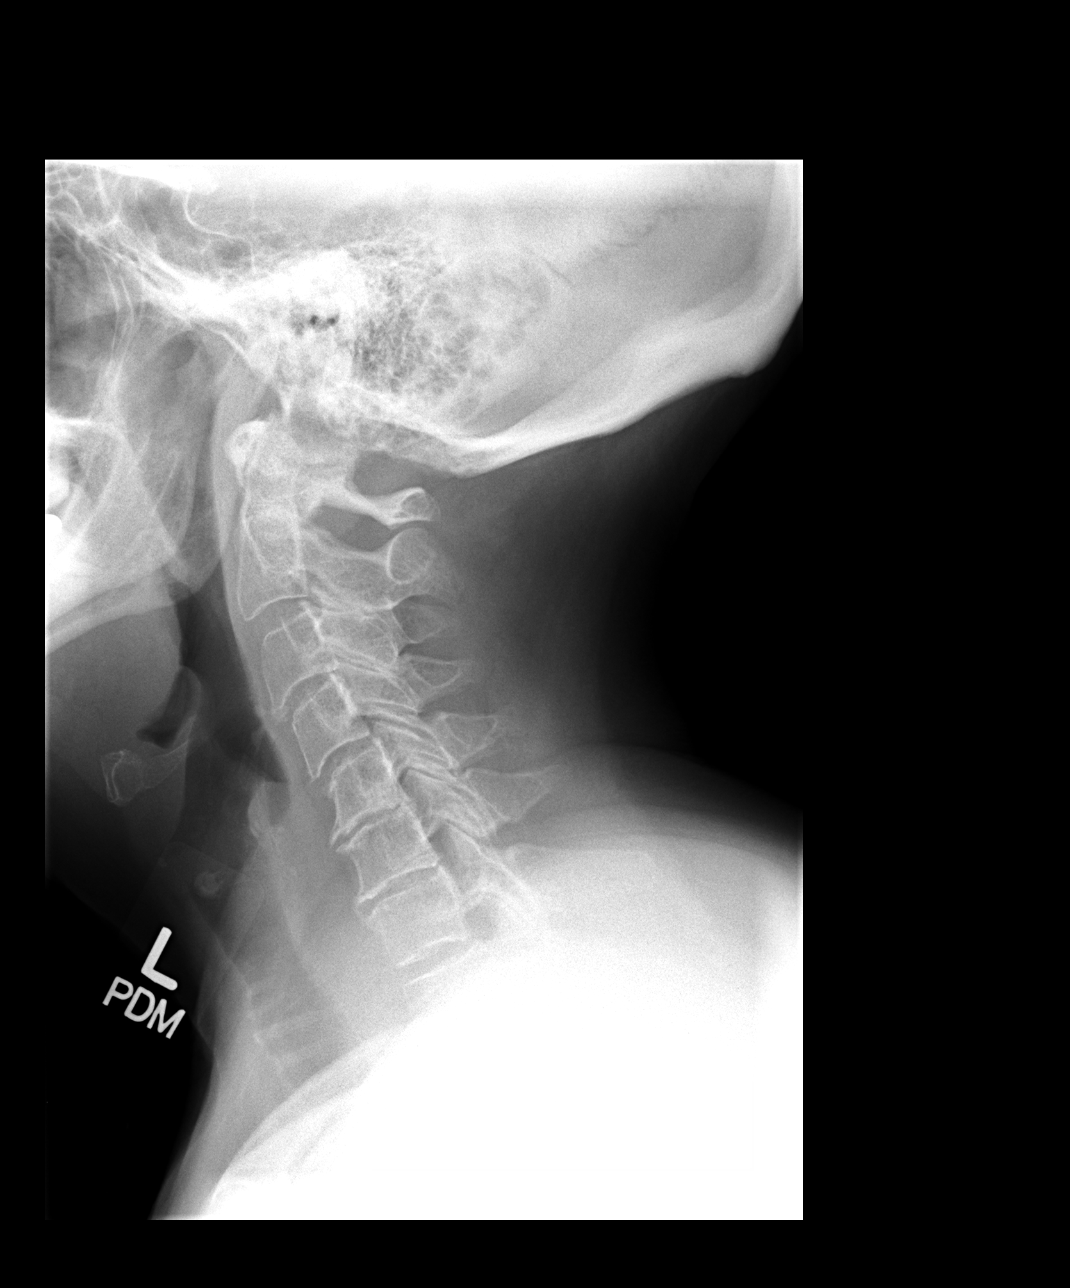

[view not recorded (3 of 3)]
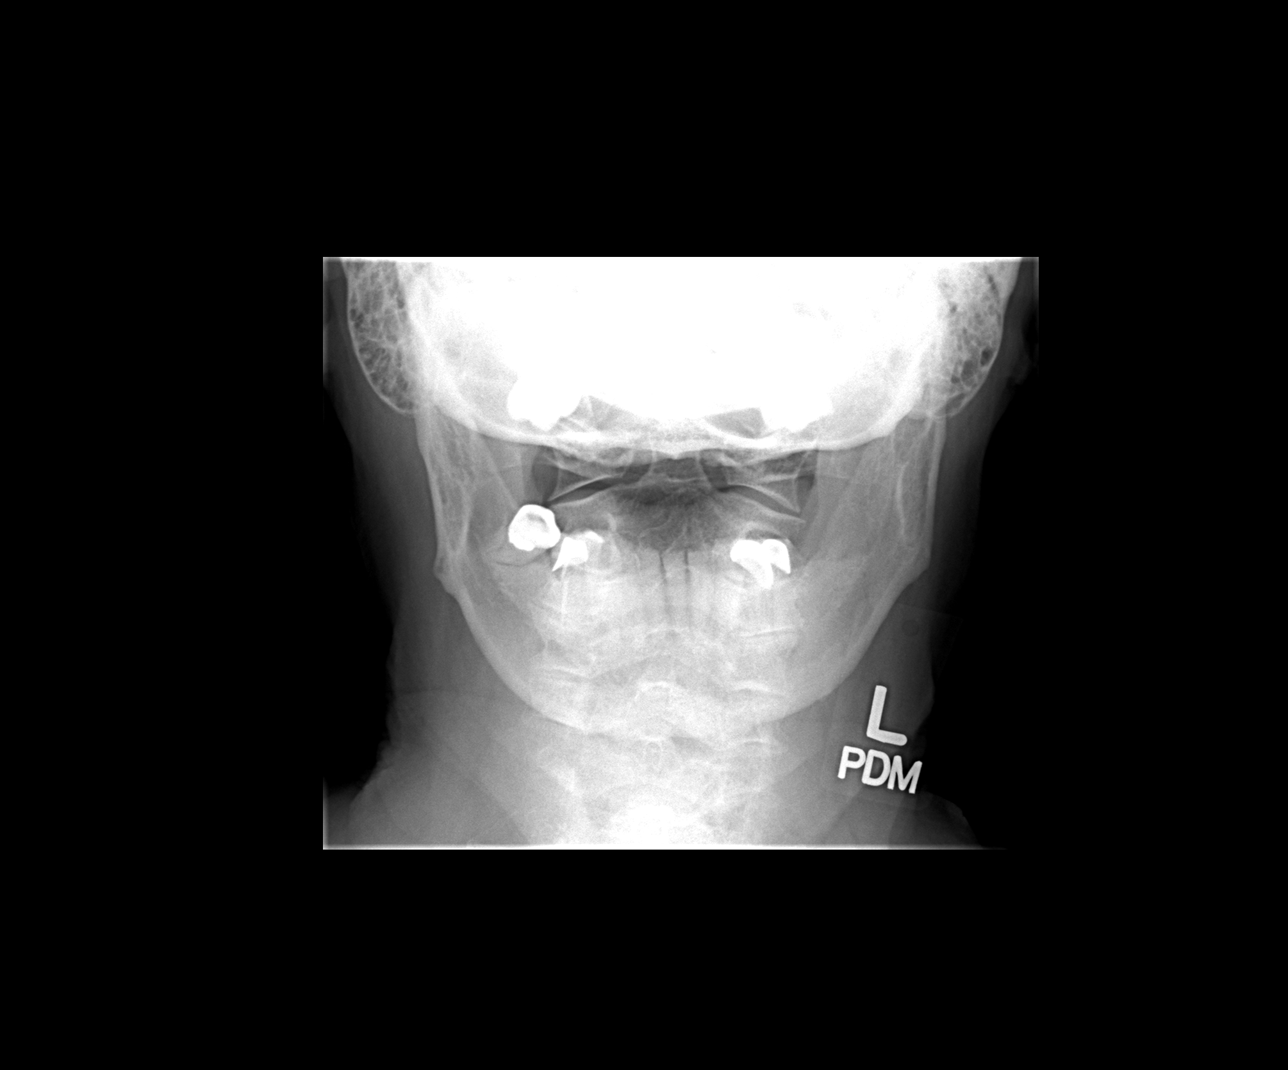

[3 of 3 positions shown; findings below may reference images not displayed]

FINDINGS: Multilevel severe degenerative change noted cervical spine with
particular prominent disc space loss and endplate osteophyte
formation noted C5-C6. Straightening of the cervical spine noted
consistent with degenerative change. Similar findings noted on prior
exam. No acute bony abnormality identified. No evidence of fracture
dislocation. Biapical pleural thickening again noted consistent
scarring. Carotid vascular calcification noted consistent with
atherosclerotic vascular disease.
IMPRESSION: 1. Stable degenerative changes of the cervical spine with particular
prominent disc space degeneration and endplate osteophyte formation
noted C5-C6. No acute bony abnormality.
2. Carotid atherosclerotic vascular disease.

## 2016-07-11 ENCOUNTER — Telehealth: Payer: Self-pay | Admitting: Internal Medicine

## 2016-07-11 NOTE — Telephone Encounter (Signed)
Patient called in to advise that she moved to a doctor in [place] and wants to come back to [place]. Are you willing to accept her back as a patient?

## 2016-07-13 NOTE — Telephone Encounter (Signed)
OK. Thx

## 2016-07-18 NOTE — Telephone Encounter (Signed)
Called and scheduled

## 2016-08-15 ENCOUNTER — Ambulatory Visit: Payer: Commercial Managed Care - HMO | Admitting: Internal Medicine

## 2016-08-22 ENCOUNTER — Ambulatory Visit (INDEPENDENT_AMBULATORY_CARE_PROVIDER_SITE_OTHER): Payer: Medicare HMO | Admitting: Internal Medicine

## 2016-08-22 ENCOUNTER — Encounter: Payer: Self-pay | Admitting: Internal Medicine

## 2016-08-22 ENCOUNTER — Ambulatory Visit (INDEPENDENT_AMBULATORY_CARE_PROVIDER_SITE_OTHER)
Admission: RE | Admit: 2016-08-22 | Discharge: 2016-08-22 | Disposition: A | Discharge status: Home / Self Care | Payer: Medicare HMO | Source: Ambulatory Visit | Attending: Internal Medicine | Admitting: Internal Medicine

## 2016-08-22 VITALS — BP 130/90 | HR 95 | Wt 141.0 lb

## 2016-08-22 DIAGNOSIS — R202 Paresthesia of skin: Secondary | ICD-10-CM

## 2016-08-22 DIAGNOSIS — G301 Alzheimer's disease with late onset: Secondary | ICD-10-CM

## 2016-08-22 DIAGNOSIS — F02818 Dementia in other diseases classified elsewhere, unspecified severity, with other behavioral disturbance: Secondary | ICD-10-CM

## 2016-08-22 DIAGNOSIS — G309 Alzheimer's disease, unspecified: Secondary | ICD-10-CM

## 2016-08-22 DIAGNOSIS — F0281 Dementia in other diseases classified elsewhere with behavioral disturbance: Secondary | ICD-10-CM | POA: Diagnosis not present

## 2016-08-22 DIAGNOSIS — I10 Essential (primary) hypertension: Secondary | ICD-10-CM | POA: Diagnosis not present

## 2016-08-22 DIAGNOSIS — F0280 Dementia in other diseases classified elsewhere without behavioral disturbance: Secondary | ICD-10-CM | POA: Insufficient documentation

## 2016-08-22 DIAGNOSIS — E785 Hyperlipidemia, unspecified: Secondary | ICD-10-CM | POA: Diagnosis not present

## 2016-08-22 MED ORDER — QUETIAPINE FUMARATE 50 MG PO TABS: 50.0000 mg | ORAL_TABLET | Freq: Every day | ORAL | 1 refills | Status: DC

## 2016-08-22 MED ORDER — DONEPEZIL HCL 5 MG PO TABS: 5.0000 mg | ORAL_TABLET | Freq: Every day | ORAL | 3 refills | Status: DC

## 2016-08-22 NOTE — Progress Notes (Signed)
Subjective:  Patient ID: [name] [name], female    DOB: [birth year]  Age: 83 y.o. MRN: [id number]  CC: No chief complaint on file.   HPI MARDA MIR presents for hearing voices, music. Comes w/son C/o anxiety Lives w/husband  Outpatient Medications Prior to Visit  Medication Sig Dispense Refill  . amLODipine (NORVASC) 5 MG tablet TAKE 1 TABLET EVERY DAY 90 tablet 3  . losartan (COZAAR) 100 MG tablet TAKE 1 TABLET EVERY DAY 90 tablet 3  . aspirin 81 MG tablet Take 81 mg by mouth daily.      . Cholecalciferol (VITAMIN D3) 1000 UNITS tablet Take 1,000 Units by mouth daily.      . fesoterodine (TOVIAZ) 8 MG TB24 Take 1 tablet (8 mg total) by mouth daily. (Patient not taking: Reported on 08/22/2016) 90 tablet 2  . levothyroxine (SYNTHROID, LEVOTHROID) 25 MCG tablet TAKE 1 TABLET EVERY DAY (Patient not taking: Reported on 08/22/2016) 90 tablet 3  . LORazepam (ATIVAN) 1 MG tablet Take 1 tablet (1 mg total) by mouth 2 (two) times daily as needed for anxiety. (Patient not taking: Reported on 08/22/2016) 60 tablet 5  . mirabegron ER (MYRBETRIQ) 50 MG TB24 tablet Take 1 tablet (50 mg total) by mouth daily. (Patient not taking: Reported on 08/22/2016) 30 tablet 11  . nystatin cream (MYCOSTATIN) as needed.    . potassium chloride (K-DUR) 10 MEQ tablet Take 1 tablet (10 mEq total) by mouth 2 (two) times daily. (Patient not taking: Reported on 08/22/2016) 60 tablet 2  . ranitidine (ZANTAC) 150 MG tablet Take 1 tablet (150 mg total) by mouth 2 (two) times daily. (Patient not taking: Reported on 08/22/2016) 180 tablet 3  . triamcinolone cream (KENALOG) 0.1 % as needed.     Facility-Administered Medications Prior to Visit  Medication Dose Route Frequency Provider Last Rate Last Dose  . methylPREDNISolone acetate (DEPO-MEDROL) injection 10.4 mg  10.4 mg Intra-articular Once Cassandria Anger, MD        ROS Review of Systems  Constitutional: Negative for activity change, appetite change, chills,  fatigue and unexpected weight change.  HENT: Negative for congestion, mouth sores and sinus pressure.   Eyes: Negative for visual disturbance.  Respiratory: Negative for cough and chest tightness.   Gastrointestinal: Negative for abdominal pain and nausea.  Genitourinary: Negative for difficulty urinating, frequency and vaginal pain.  Musculoskeletal: Negative for back pain and gait problem.  Skin: Negative for pallor and rash.  Neurological: Negative for dizziness, tremors, speech difficulty, weakness, numbness and headaches.  Psychiatric/Behavioral: Positive for agitation, behavioral problems, confusion, decreased concentration, hallucinations and sleep disturbance. Negative for dysphoric mood, self-injury and suicidal ideas. The patient is nervous/anxious and is hyperactive.     Objective:  BP 130/90   Pulse 95   Wt 141 lb (64 kg)   SpO2 96%   BMI 22.76 kg/m   BP Readings from Last 3 Encounters:  08/22/16 130/90  10/18/15 (!) 160/86  06/20/15 (!) 142/96    Wt Readings from Last 3 Encounters:  08/22/16 141 lb (64 kg)  10/18/15 152 lb (68.9 kg)  06/20/15 162 lb (73.5 kg)    Physical Exam  Constitutional: She appears well-developed. No distress.  HENT:  Head: Normocephalic.  Right Ear: External ear normal.  Left Ear: External ear normal.  Nose: Nose normal.  Mouth/Throat: Oropharynx is clear and moist.  Eyes: Conjunctivae are normal. Pupils are equal, round, and reactive to light. Right eye exhibits no discharge. Left eye exhibits no discharge.  Neck: Normal range of motion. Neck supple. No JVD present. No tracheal deviation present. No thyromegaly present.  Cardiovascular: Normal rate, regular rhythm and normal heart sounds.   Pulmonary/Chest: No stridor. No respiratory distress. She has no wheezes.  Abdominal: Soft. Bowel sounds are normal. She exhibits no distension and no mass. There is no tenderness. There is no rebound and no guarding.  Musculoskeletal: She exhibits  no edema or tenderness.  Lymphadenopathy:    She has no cervical adenopathy.  Neurological: She displays normal reflexes. No cranial nerve deficit. She exhibits normal muscle tone. Coordination normal.  Skin: No rash noted. No erythema.  a little agitated forgetful  Lab Results  Component Value Date   WBC 5.7 12/15/2014   HGB 13.0 12/15/2014   HCT 38.9 12/15/2014   PLT 61.0 (L) 12/15/2014   GLUCOSE 91 12/15/2014   CHOL 228 (H) 08/16/2014   TRIG 84.0 08/16/2014   HDL 38.10 (L) 08/16/2014   LDLDIRECT 172.4 12/04/2010   LDLCALC 173 (H) 08/16/2014   ALT 13 12/15/2014   AST 16 12/15/2014   NA 140 12/15/2014   K 3.5 12/15/2014   CL 102 12/15/2014   CREATININE 0.98 12/15/2014   BUN 18 12/15/2014   CO2 28 12/15/2014   TSH 4.19 12/15/2014   INR 1.0 ratio 12/10/2009   HGBA1C 5.7 05/28/2010    Dg Cervical Spine 2 Or 3 Views  Result Date: 03/20/2015 CLINICAL DATA:  Neck stiffness. No known injury. Initial evaluation. EXAM: CERVICAL SPINE - 2-3 VIEW COMPARISON:  09/28/2013 FINDINGS: Multilevel severe degenerative change noted cervical spine with particular prominent disc space loss and endplate osteophyte formation noted C5-C6. Straightening of the cervical spine noted consistent with degenerative change. Similar findings noted on prior exam. No acute bony abnormality identified. No evidence of fracture dislocation. Biapical pleural thickening again noted consistent scarring. Carotid vascular calcification noted consistent with atherosclerotic vascular disease. IMPRESSION: 1. Stable degenerative changes of the cervical spine with particular prominent disc space degeneration and endplate osteophyte formation noted C5-C6. No acute bony abnormality. 2. Carotid atherosclerotic vascular disease. Electronically Signed   By: Marcello Moores  Register   On: 03/20/2015 08:44    Assessment & Plan:   There are no diagnoses linked to this encounter. I am having Ms. Vahle maintain her aspirin, cholecalciferol,  fesoterodine, potassium chloride, ranitidine, mirabegron ER, LORazepam, nystatin cream, triamcinolone cream, levothyroxine, amLODipine, losartan, levothyroxine, and esomeprazole. We will continue to administer methylPREDNISolone acetate.  Meds ordered this encounter  Medications  . levothyroxine (SYNTHROID, LEVOTHROID) 75 MCG tablet    Sig: Take 1 tablet by mouth daily.  Marland Kitchen esomeprazole (NEXIUM 24HR) 20 MG capsule    Sig: Take 20 mg by mouth daily at 12 noon.     Follow-up: No Follow-up on file.  Walker Kehr, MD

## 2016-08-22 NOTE — Assessment & Plan Note (Signed)
Seeing a psychiatrist Start Aricept 5 mg/d

## 2016-08-22 NOTE — Assessment & Plan Note (Signed)
BP Readings from Last 3 Encounters:  08/22/16 130/90  10/18/15 (!) 160/86  06/20/15 (!) 142/96

## 2016-08-22 NOTE — Assessment & Plan Note (Signed)
Per Psychiatry 

## 2016-08-22 NOTE — Progress Notes (Signed)
Pre visit review using our clinic review tool, if applicable. No additional management support is needed unless otherwise documented below in the visit note. 

## 2016-08-25 ENCOUNTER — Telehealth: Payer: Self-pay | Admitting: Internal Medicine

## 2016-08-25 NOTE — Telephone Encounter (Signed)
Please notify7 patient of ct results

## 2016-08-26 NOTE — Telephone Encounter (Signed)
Addressed Thx 

## 2016-09-03 ENCOUNTER — Other Ambulatory Visit: Payer: Self-pay | Admitting: Internal Medicine

## 2016-09-09 ENCOUNTER — Telehealth: Payer: Self-pay | Admitting: Emergency Medicine

## 2016-09-09 NOTE — Telephone Encounter (Signed)
[facility] called and patient has an appt to be seen. She is wondering if you can fax over a copy of the patients medication list and medical history Fax # [phone]. Please advise thanks.

## 2016-09-11 NOTE — Telephone Encounter (Signed)
Routing to dr [employer], are you ok with sending these records---should patient sign medical release form?---please advise, thanks

## 2016-09-11 NOTE — Telephone Encounter (Signed)
faxed

## 2016-09-11 NOTE — Telephone Encounter (Signed)
Ok to fax Thx

## 2016-09-16 ENCOUNTER — Telehealth: Payer: Self-pay | Admitting: Internal Medicine

## 2016-09-16 NOTE — Telephone Encounter (Signed)
Patient son - [name] called in. He came with his mother to her last visit. He is unable to come to the one coming up. He wanted to talk to the nurse and give her some information. He does not think hes mother is doing well. And she is not taking her medication right. He would really like to follow up with a nurse. Thank you.

## 2016-09-22 ENCOUNTER — Encounter: Payer: Self-pay | Admitting: Internal Medicine

## 2016-09-22 ENCOUNTER — Ambulatory Visit (INDEPENDENT_AMBULATORY_CARE_PROVIDER_SITE_OTHER): Payer: Medicare HMO | Admitting: Internal Medicine

## 2016-09-22 DIAGNOSIS — G301 Alzheimer's disease with late onset: Secondary | ICD-10-CM

## 2016-09-22 DIAGNOSIS — L4050 Arthropathic psoriasis, unspecified: Secondary | ICD-10-CM

## 2016-09-22 DIAGNOSIS — F02818 Dementia in other diseases classified elsewhere, unspecified severity, with other behavioral disturbance: Secondary | ICD-10-CM

## 2016-09-22 DIAGNOSIS — E785 Hyperlipidemia, unspecified: Secondary | ICD-10-CM

## 2016-09-22 DIAGNOSIS — F0281 Dementia in other diseases classified elsewhere with behavioral disturbance: Secondary | ICD-10-CM

## 2016-09-22 DIAGNOSIS — R55 Syncope and collapse: Secondary | ICD-10-CM | POA: Diagnosis not present

## 2016-09-22 MED ORDER — DONEPEZIL HCL 10 MG PO TABS: 10.0000 mg | ORAL_TABLET | Freq: Every day | ORAL | 3 refills | Status: DC

## 2016-09-22 NOTE — Assessment & Plan Note (Signed)
We increased Aricept to 10 mg/d and will cont w/Seroquel at hs

## 2016-09-22 NOTE — Progress Notes (Signed)
Pre-visit discussion using our clinic review tool. No additional management support is needed unless otherwise documented below in the visit note.  

## 2016-09-22 NOTE — Progress Notes (Signed)
Subjective:  Patient ID: [name] [name], female    DOB: 1932-09-18  Age: 83 y.o. MRN: [id number]  CC: Follow-up (dislipidemia, memory loss); Rash (left leg); and Back Pain (lower back and rearend)   HPI [name] [name] presents for memory loss. Sleeping better. She fell and fx her L shoulder 2.5 wks ago. Sister brought her in...  Outpatient Medications Prior to Visit  Medication Sig Dispense Refill  . amLODipine (NORVASC) 5 MG tablet TAKE 1 TABLET EVERY DAY 90 tablet 3  . aspirin 81 MG tablet Take 81 mg by mouth daily.      . Cholecalciferol (VITAMIN D3) 1000 UNITS tablet Take 1,000 Units by mouth daily.      Marland Kitchen donepezil (ARICEPT) 5 MG tablet Take 1 tablet (5 mg total) by mouth at bedtime. 30 tablet 3  . esomeprazole (NEXIUM 24HR) 20 MG capsule Take 20 mg by mouth daily at 12 noon.    . fesoterodine (TOVIAZ) 8 MG TB24 Take 1 tablet (8 mg total) by mouth daily. 90 tablet 2  . levothyroxine (SYNTHROID, LEVOTHROID) 75 MCG tablet Take 1 tablet by mouth daily.    Marland Kitchen LORazepam (ATIVAN) 1 MG tablet Take 1 tablet (1 mg total) by mouth 2 (two) times daily as needed for anxiety. 60 tablet 5  . losartan (COZAAR) 100 MG tablet TAKE 1 TABLET EVERY DAY 90 tablet 3  . mirabegron ER (MYRBETRIQ) 50 MG TB24 tablet Take 1 tablet (50 mg total) by mouth daily. 30 tablet 11  . nystatin cream (MYCOSTATIN) as needed.    . potassium chloride (K-DUR) 10 MEQ tablet Take 1 tablet (10 mEq total) by mouth 2 (two) times daily. 60 tablet 2  . QUEtiapine (SEROQUEL) 50 MG tablet Take 1 tablet (50 mg total) by mouth at bedtime. 90 tablet 1  . ranitidine (ZANTAC) 150 MG tablet Take 1 tablet (150 mg total) by mouth 2 (two) times daily. 180 tablet 3  . triamcinolone cream (KENALOG) 0.1 % as needed.     Facility-Administered Medications Prior to Visit  Medication Dose Route Frequency Provider Last Rate Last Dose  . methylPREDNISolone acetate (DEPO-MEDROL) injection 10.4 mg  10.4 mg Intra-articular Once [name] Corporan V,  MD        ROS Review of Systems  Constitutional: Negative for activity change, appetite change, chills, fatigue and unexpected weight change.  HENT: Negative for congestion, mouth sores and sinus pressure.   Eyes: Negative for visual disturbance.  Respiratory: Negative for cough and chest tightness.   Gastrointestinal: Negative for abdominal pain and nausea.  Genitourinary: Negative for difficulty urinating, frequency and vaginal pain.  Musculoskeletal: Negative for back pain and gait problem.  Skin: Negative for pallor and rash.  Neurological: Negative for dizziness, tremors, weakness, numbness and headaches.  Psychiatric/Behavioral: Positive for decreased concentration. Negative for confusion, sleep disturbance and suicidal ideas. The patient is nervous/anxious.     Objective:  BP (!) 148/84   Pulse (!) 103   Temp 98 F (36.7 C) (Oral)   Resp 16   Ht 5\' 6"  (1.676 m)   Wt 144 lb 12 oz (65.7 kg)   SpO2 98%   BMI 23.36 kg/m   BP Readings from Last 3 Encounters:  09/22/16 (!) 148/84  08/22/16 130/90  10/18/15 (!) 160/86    Wt Readings from Last 3 Encounters:  09/22/16 144 lb 12 oz (65.7 kg)  08/22/16 141 lb (64 kg)  10/18/15 152 lb (68.9 kg)    Physical Exam  Constitutional: She appears well-developed.  No distress.  HENT:  Head: Normocephalic.  Right Ear: External ear normal.  Left Ear: External ear normal.  Nose: Nose normal.  Mouth/Throat: Oropharynx is clear and moist.  Eyes: Conjunctivae are normal. Pupils are equal, round, and reactive to light. Right eye exhibits no discharge. Left eye exhibits no discharge.  Neck: Normal range of motion. Neck supple. No JVD present. No tracheal deviation present. No thyromegaly present.  Cardiovascular: Normal rate, regular rhythm and normal heart sounds.   Pulmonary/Chest: No stridor. No respiratory distress. She has no wheezes.  Abdominal: Soft. Bowel sounds are normal. She exhibits no distension and no mass. There is no  tenderness. There is no rebound and no guarding.  Musculoskeletal: She exhibits no edema or tenderness.  Lymphadenopathy:    She has no cervical adenopathy.  Neurological: She displays normal reflexes. No cranial nerve deficit. She exhibits normal muscle tone. Coordination normal.  Skin: No rash noted. No erythema.  Psychiatric: She has a normal mood and affect.  mildly agitated, talkative  Lab Results  Component Value Date   WBC 5.7 12/15/2014   HGB 13.0 12/15/2014   HCT 38.9 12/15/2014   PLT 61.0 (L) 12/15/2014   GLUCOSE 91 12/15/2014   CHOL 228 (H) 08/16/2014   TRIG 84.0 08/16/2014   HDL 38.10 (L) 08/16/2014   LDLDIRECT 172.4 12/04/2010   LDLCALC 173 (H) 08/16/2014   ALT 13 12/15/2014   AST 16 12/15/2014   NA 140 12/15/2014   K 3.5 12/15/2014   CL 102 12/15/2014   CREATININE 0.98 12/15/2014   BUN 18 12/15/2014   CO2 28 12/15/2014   TSH 4.19 12/15/2014   INR 1.0 ratio 12/10/2009   HGBA1C 5.7 05/28/2010    Ct Head Wo Contrast  Result Date: 08/22/2016 CLINICAL DATA:  Increased memory loss over the past 2 months. Late onset Alzheimer's disease with behavioral disturbances. EXAM: CT HEAD WITHOUT CONTRAST TECHNIQUE: Contiguous axial images were obtained from the base of the skull through the vertex without intravenous contrast. COMPARISON:  None. FINDINGS: Brain: Diffusely enlarged ventricles and subarachnoid spaces. Patchy white matter low density in both cerebral hemispheres. No intracranial hemorrhage, mass lesion or CT evidence of acute infarction. Vascular: No hyperdense vessel or unexpected calcification. Skull: Normal. Negative for fracture or focal lesion. Sinuses/Orbits: No acute finding. Other: None. IMPRESSION: No acute abnormality. Mild to moderate diffuse cerebral and cerebellar atrophy and moderate chronic small vessel white matter ischemic changes in both cerebral hemispheres. Electronically Signed   By: Claudie Revering M.D.   On: 08/22/2016 14:33    Assessment & Plan:    There are no diagnoses linked to this encounter. I am having [name] maintain her aspirin, cholecalciferol, fesoterodine, potassium chloride, ranitidine, mirabegron ER, LORazepam, nystatin cream, triamcinolone cream, levothyroxine, esomeprazole, donepezil, QUEtiapine, losartan, amLODipine, and oxyCODONE. We will continue to administer methylPREDNISolone acetate.  Meds ordered this encounter  Medications  . oxyCODONE (OXY IR/ROXICODONE) 5 MG immediate release tablet     Follow-up: No Follow-up on file.  Walker Kehr, MD

## 2016-09-22 NOTE — Assessment & Plan Note (Signed)
No change Cont w/topical Rx

## 2016-09-22 NOTE — Assessment & Plan Note (Signed)
Low fat diet

## 2016-09-22 NOTE — Assessment & Plan Note (Signed)
Recurrent vaso-vagal - 2 wks ago Bruised L chest and a little swollen L shoulder

## 2016-09-22 NOTE — Telephone Encounter (Signed)
Patients son is calling again about this. I informed him again that it is much easier if a family member could be here at the appointment. He really wants to talk to Dr. Camila Li or his nurse before his mother comes in today for her appointment at 2:30pm. He keeps saying that something is really wrong with her. But says she is going to come in saying nothing is wrong. If you could please follow up with him.

## 2016-11-20 ENCOUNTER — Encounter: Payer: Self-pay | Admitting: Internal Medicine

## 2016-11-20 ENCOUNTER — Ambulatory Visit (INDEPENDENT_AMBULATORY_CARE_PROVIDER_SITE_OTHER): Payer: Medicare HMO | Admitting: Internal Medicine

## 2016-11-20 ENCOUNTER — Other Ambulatory Visit (INDEPENDENT_AMBULATORY_CARE_PROVIDER_SITE_OTHER): Payer: Medicare HMO

## 2016-11-20 DIAGNOSIS — E034 Atrophy of thyroid (acquired): Secondary | ICD-10-CM | POA: Diagnosis not present

## 2016-11-20 DIAGNOSIS — I739 Peripheral vascular disease, unspecified: Secondary | ICD-10-CM

## 2016-11-20 DIAGNOSIS — F0281 Dementia in other diseases classified elsewhere with behavioral disturbance: Secondary | ICD-10-CM

## 2016-11-20 DIAGNOSIS — G301 Alzheimer's disease with late onset: Secondary | ICD-10-CM | POA: Diagnosis not present

## 2016-11-20 DIAGNOSIS — R202 Paresthesia of skin: Secondary | ICD-10-CM | POA: Diagnosis not present

## 2016-11-20 DIAGNOSIS — E785 Hyperlipidemia, unspecified: Secondary | ICD-10-CM | POA: Diagnosis not present

## 2016-11-20 DIAGNOSIS — I10 Essential (primary) hypertension: Secondary | ICD-10-CM | POA: Diagnosis not present

## 2016-11-20 DIAGNOSIS — F3289 Other specified depressive episodes: Secondary | ICD-10-CM

## 2016-11-20 DIAGNOSIS — F02818 Dementia in other diseases classified elsewhere, unspecified severity, with other behavioral disturbance: Secondary | ICD-10-CM

## 2016-11-20 LAB — HEPATIC FUNCTION PANEL
ALBUMIN: 4.2 g/dL (ref 3.5–5.2)
ALK PHOS: 56 U/L (ref 39–117)
ALT: 24 U/L (ref 0–35)
AST: 23 U/L (ref 0–37)
Bilirubin, Direct: 0.1 mg/dL (ref 0.0–0.3)
TOTAL PROTEIN: 7.5 g/dL (ref 6.0–8.3)
Total Bilirubin: 0.5 mg/dL (ref 0.2–1.2)

## 2016-11-20 LAB — URINALYSIS
Bilirubin Urine: NEGATIVE
KETONES UR: NEGATIVE
Leukocytes, UA: NEGATIVE
Nitrite: NEGATIVE
PH: 7 (ref 5.0–8.0)
TOTAL PROTEIN, URINE-UPE24: NEGATIVE
URINE GLUCOSE: NEGATIVE
Urobilinogen, UA: 0.2 (ref 0.0–1.0)

## 2016-11-20 LAB — BASIC METABOLIC PANEL
BUN: 14 mg/dL (ref 6–23)
CHLORIDE: 106 meq/L (ref 96–112)
CO2: 26 mEq/L (ref 19–32)
CREATININE: 0.87 mg/dL (ref 0.40–1.20)
Calcium: 9.6 mg/dL (ref 8.4–10.5)
GFR: 66.24 mL/min (ref >60.00)
GLUCOSE: 92 mg/dL (ref 70–99)
POTASSIUM: 3.9 meq/L (ref 3.5–5.1)
Sodium: 139 mEq/L (ref 135–145)

## 2016-11-20 LAB — CBC WITH DIFFERENTIAL/PLATELET
BASOS PCT: 1 % (ref 0.0–3.0)
Basophils Absolute: 0 10*3/uL (ref 0.0–0.1)
EOS ABS: 0 10*3/uL (ref 0.0–0.7)
EOS PCT: 0.4 % (ref 0.0–5.0)
HCT: 36.4 % (ref 36.0–46.0)
Hemoglobin: 11.8 g/dL — ABNORMAL LOW (ref 12.0–15.0)
LYMPHS ABS: 1.7 10*3/uL (ref 0.7–4.0)
Lymphocytes Relative: 42.5 % (ref 12.0–46.0)
MCHC: 32.5 g/dL (ref 30.0–36.0)
MCV: 81.4 fl (ref 78.0–100.0)
MONO ABS: 0.6 10*3/uL (ref 0.1–1.0)
Monocytes Relative: 15.3 % — ABNORMAL HIGH (ref 3.0–12.0)
NEUTROS PCT: 40.8 % — AB (ref 43.0–77.0)
Neutro Abs: 1.6 10*3/uL (ref 1.4–7.7)
Platelets: 62 10*3/uL — ABNORMAL LOW (ref 150.0–400.0)
RBC: 4.47 Mil/uL (ref 3.87–5.11)
RDW: 16.7 % — AB (ref 11.5–15.5)
WBC: 3.9 10*3/uL — ABNORMAL LOW (ref 4.0–10.5)

## 2016-11-20 LAB — TSH: TSH: 2.47 u[IU]/mL (ref 0.35–4.50)

## 2016-11-20 LAB — VITAMIN B12: Vitamin B-12: 519 pg/mL (ref 211–911)

## 2016-11-20 NOTE — Assessment & Plan Note (Signed)
Re-start ASA 81

## 2016-11-20 NOTE — Progress Notes (Signed)
Subjective:  Patient ID: [name] [name], female    DOB: [birth year]  Age: 83 y.o. MRN: [id number]  CC: No chief complaint on file.   HPI [name] [name] presents for memory loss, HTN, anxiety f/u C/o tinnitus at times  Outpatient Medications Prior to Visit  Medication Sig Dispense Refill  . amLODipine (NORVASC) 5 MG tablet TAKE 1 TABLET EVERY DAY 90 tablet 3  . aspirin 81 MG tablet Take 81 mg by mouth daily.      . Cholecalciferol (VITAMIN D3) 1000 UNITS tablet Take 1,000 Units by mouth daily.      Marland Kitchen donepezil (ARICEPT) 10 MG tablet Take 1 tablet (10 mg total) by mouth at bedtime. 90 tablet 3  . esomeprazole (NEXIUM 24HR) 20 MG capsule Take 20 mg by mouth daily at 12 noon.    . fesoterodine (TOVIAZ) 8 MG TB24 Take 1 tablet (8 mg total) by mouth daily. 90 tablet 2  . levothyroxine (SYNTHROID, LEVOTHROID) 75 MCG tablet Take 1 tablet by mouth daily.    Marland Kitchen LORazepam (ATIVAN) 1 MG tablet Take 1 tablet (1 mg total) by mouth 2 (two) times daily as needed for anxiety. 60 tablet 5  . losartan (COZAAR) 100 MG tablet TAKE 1 TABLET EVERY DAY 90 tablet 3  . mirabegron ER (MYRBETRIQ) 50 MG TB24 tablet Take 1 tablet (50 mg total) by mouth daily. 30 tablet 11  . nystatin cream (MYCOSTATIN) as needed.    . potassium chloride (K-DUR) 10 MEQ tablet Take 1 tablet (10 mEq total) by mouth 2 (two) times daily. 60 tablet 2  . QUEtiapine (SEROQUEL) 50 MG tablet Take 1 tablet (50 mg total) by mouth at bedtime. 90 tablet 1  . ranitidine (ZANTAC) 150 MG tablet Take 1 tablet (150 mg total) by mouth 2 (two) times daily. 180 tablet 3  . triamcinolone cream (KENALOG) 0.1 % as needed.     Facility-Administered Medications Prior to Visit  Medication Dose Route Frequency Provider Last Rate Last Dose  . methylPREDNISolone acetate (DEPO-MEDROL) injection 10.4 mg  10.4 mg Intra-articular Once Solan Vosler V, MD        ROS Review of Systems  Constitutional: Negative for activity change, appetite change, chills,  fatigue and unexpected weight change.  HENT: Positive for tinnitus. Negative for congestion, mouth sores, sinus pain and sinus pressure.   Eyes: Negative for visual disturbance.  Respiratory: Negative for cough and chest tightness.   Gastrointestinal: Negative for abdominal pain and nausea.  Genitourinary: Negative for difficulty urinating, frequency and vaginal pain.  Musculoskeletal: Negative for back pain and gait problem.  Skin: Negative for pallor and rash.  Neurological: Negative for dizziness, tremors, weakness, numbness and headaches.  Psychiatric/Behavioral: Positive for decreased concentration. Negative for confusion, sleep disturbance and suicidal ideas. The patient is nervous/anxious.     Objective:  BP (!) 152/86 (BP Location: Left Arm, Patient Position: Sitting, Cuff Size: Normal)   Pulse 75   Temp 97.9 F (36.6 C) (Oral)   Ht 5\' 6"  (1.676 m)   Wt 149 lb (67.6 kg)   SpO2 99%   BMI 24.05 kg/m   BP Readings from Last 3 Encounters:  11/20/16 (!) 152/86  09/22/16 (!) 148/84  08/22/16 130/90    Wt Readings from Last 3 Encounters:  11/20/16 149 lb (67.6 kg)  09/22/16 144 lb 12 oz (65.7 kg)  08/22/16 141 lb (64 kg)    Physical Exam  Constitutional: She appears well-developed. No distress.  HENT:  Head: Normocephalic.  Right Ear:  External ear normal.  Left Ear: External ear normal.  Nose: Nose normal.  Mouth/Throat: Oropharynx is clear and moist.  Eyes: Conjunctivae are normal. Pupils are equal, round, and reactive to light. Right eye exhibits no discharge. Left eye exhibits no discharge.  Neck: Normal range of motion. Neck supple. No JVD present. No tracheal deviation present. No thyromegaly present.  Cardiovascular: Normal rate, regular rhythm and normal heart sounds.   Pulmonary/Chest: No stridor. No respiratory distress. She has no wheezes.  Abdominal: Soft. Bowel sounds are normal. She exhibits no distension and no mass. There is no tenderness. There is no  rebound and no guarding.  Musculoskeletal: She exhibits no edema or tenderness.  Lymphadenopathy:    She has no cervical adenopathy.  Neurological: She displays normal reflexes. No cranial nerve deficit. She exhibits normal muscle tone. Coordination normal.  Skin: No rash noted. No erythema.  Psychiatric: She has a normal mood and affect. Her behavior is normal. Judgment and thought content normal.  talkative A/o/c  Lab Results  Component Value Date   WBC 5.7 12/15/2014   HGB 13.0 12/15/2014   HCT 38.9 12/15/2014   PLT 61.0 (L) 12/15/2014   GLUCOSE 91 12/15/2014   CHOL 228 (H) 08/16/2014   TRIG 84.0 08/16/2014   HDL 38.10 (L) 08/16/2014   LDLDIRECT 172.4 12/04/2010   LDLCALC 173 (H) 08/16/2014   ALT 13 12/15/2014   AST 16 12/15/2014   NA 140 12/15/2014   K 3.5 12/15/2014   CL 102 12/15/2014   CREATININE 0.98 12/15/2014   BUN 18 12/15/2014   CO2 28 12/15/2014   TSH 4.19 12/15/2014   INR 1.0 ratio 12/10/2009   HGBA1C 5.7 05/28/2010    Ct Head Wo Contrast  Result Date: 08/22/2016 CLINICAL DATA:  Increased memory loss over the past 2 months. Late onset Alzheimer's disease with behavioral disturbances. EXAM: CT HEAD WITHOUT CONTRAST TECHNIQUE: Contiguous axial images were obtained from the base of the skull through the vertex without intravenous contrast. COMPARISON:  None. FINDINGS: Brain: Diffusely enlarged ventricles and subarachnoid spaces. Patchy white matter low density in both cerebral hemispheres. No intracranial hemorrhage, mass lesion or CT evidence of acute infarction. Vascular: No hyperdense vessel or unexpected calcification. Skull: Normal. Negative for fracture or focal lesion. Sinuses/Orbits: No acute finding. Other: None. IMPRESSION: No acute abnormality. Mild to moderate diffuse cerebral and cerebellar atrophy and moderate chronic small vessel white matter ischemic changes in both cerebral hemispheres. Electronically Signed   By: Claudie Revering M.D.   On: 08/22/2016  14:33    Assessment & Plan:   There are no diagnoses linked to this encounter. I am having Ms. Kiernan maintain her aspirin, cholecalciferol, fesoterodine, potassium chloride, ranitidine, mirabegron ER, LORazepam, nystatin cream, triamcinolone cream, levothyroxine, esomeprazole, QUEtiapine, losartan, amLODipine, and donepezil. We will continue to administer methylPREDNISolone acetate.  No orders of the defined types were placed in this encounter.    Follow-up: No Follow-up on file.  Walker Kehr, MD

## 2016-11-20 NOTE — Assessment & Plan Note (Signed)
Cont w/Aricept to 10 mg/d  Seroquel at hs - doing well

## 2016-11-20 NOTE — Assessment & Plan Note (Signed)
Doing well Labs today 

## 2016-11-20 NOTE — Assessment & Plan Note (Signed)
Cont Losartan, Norvasc Labs

## 2016-11-20 NOTE — Progress Notes (Signed)
Pre visit review using our clinic review tool, if applicable. No additional management support is needed unless otherwise documented below in the visit note. 

## 2016-11-20 NOTE — Assessment & Plan Note (Signed)
Levothroid 

## 2016-12-23 DIAGNOSIS — F4321 Adjustment disorder with depressed mood: Secondary | ICD-10-CM | POA: Diagnosis not present

## 2017-01-08 DIAGNOSIS — F4321 Adjustment disorder with depressed mood: Secondary | ICD-10-CM | POA: Diagnosis not present

## 2017-02-10 DIAGNOSIS — F4321 Adjustment disorder with depressed mood: Secondary | ICD-10-CM | POA: Diagnosis not present

## 2017-02-24 ENCOUNTER — Ambulatory Visit (INDEPENDENT_AMBULATORY_CARE_PROVIDER_SITE_OTHER): Payer: Medicare HMO | Admitting: Internal Medicine

## 2017-02-24 ENCOUNTER — Encounter: Payer: Self-pay | Admitting: Internal Medicine

## 2017-02-24 ENCOUNTER — Telehealth: Payer: Self-pay | Admitting: Internal Medicine

## 2017-02-24 VITALS — BP 132/84 | HR 94 | Temp 98.2°F | Ht 66.0 in | Wt 148.0 lb

## 2017-02-24 DIAGNOSIS — G8929 Other chronic pain: Secondary | ICD-10-CM | POA: Diagnosis not present

## 2017-02-24 DIAGNOSIS — M5442 Lumbago with sciatica, left side: Secondary | ICD-10-CM

## 2017-02-24 DIAGNOSIS — F0281 Dementia in other diseases classified elsewhere with behavioral disturbance: Secondary | ICD-10-CM | POA: Diagnosis not present

## 2017-02-24 DIAGNOSIS — F411 Generalized anxiety disorder: Secondary | ICD-10-CM

## 2017-02-24 DIAGNOSIS — G301 Alzheimer's disease with late onset: Secondary | ICD-10-CM

## 2017-02-24 DIAGNOSIS — M5441 Lumbago with sciatica, right side: Secondary | ICD-10-CM | POA: Diagnosis not present

## 2017-02-24 DIAGNOSIS — E2839 Other primary ovarian failure: Secondary | ICD-10-CM

## 2017-02-24 DIAGNOSIS — F02818 Alzheimer's disease with late onset: Secondary | ICD-10-CM

## 2017-02-24 DIAGNOSIS — F4321 Adjustment disorder with depressed mood: Secondary | ICD-10-CM | POA: Diagnosis not present

## 2017-02-24 DIAGNOSIS — I10 Essential (primary) hypertension: Secondary | ICD-10-CM | POA: Diagnosis not present

## 2017-02-24 NOTE — Assessment & Plan Note (Signed)
Doing well 

## 2017-02-24 NOTE — Telephone Encounter (Signed)
She is seeing Dr Amalia Greenhouse (Psychiatry) and a therapist regular. I called Dr Plovsky's office and confirmed this. Thx

## 2017-02-24 NOTE — Assessment & Plan Note (Signed)
Losartan, Norvasc 

## 2017-02-24 NOTE — Assessment & Plan Note (Addendum)
Aricept to 10 mg/d  Seroquel at hs She states she is seeing Dr Casimiro Needle - he stopped Seroquel and donepezil because she was doing well...   She is seeing him and a therapist regular. I called Dr Plovsky's office and confirmed this.

## 2017-02-24 NOTE — Telephone Encounter (Signed)
[name] [name] called concerned about his mother, she is coming into day to see [facility] and wanted to talk to the nurse concerning her memory issues and the changes he has noticed. The son lives in [place] and will not be coming into the appt today,  The son is concerned that she is not taking any of her medications from neither [facility] nor her psychologists. He is also worried about her decrease in strength as she has a hard time getting out of her chair at home. States the Pt stays in a worried state and is always anxious. She lives with her husband but they are always fighting and this may be a cause. When the Pt talks to ehr son at night he states she repeats herself a lot and often to refers to the early 1930's and 90's and talks about the past a lot.  The son would like to see if a neurologists could help and wanted [facility] and [name] to be aware of all of this for her appt for today.  Please call son back if you have any questions.

## 2017-02-24 NOTE — Progress Notes (Signed)
Subjective:  Patient ID: [name] [name], female    DOB: 1933/02/12  Age: 83 y.o. MRN: [id number]  CC: No chief complaint on file.   HPI [name] [name] presents for hypothyroidism, memory issues, dementia w/paranoia. She states she saw Dr Casimiro Needle - he stopped Seroquel and Aricept because she was doing well... She is seeing him and a therapist regular. I called Dr Plovsky's office and confirmed this.  Outpatient Medications Prior to Visit  Medication Sig Dispense Refill  . amLODipine (NORVASC) 5 MG tablet TAKE 1 TABLET EVERY DAY 90 tablet 3  . aspirin 81 MG tablet Take 81 mg by mouth daily.      . Cholecalciferol (VITAMIN D3) 1000 UNITS tablet Take 1,000 Units by mouth daily.      Marland Kitchen donepezil (ARICEPT) 10 MG tablet Take 1 tablet (10 mg total) by mouth at bedtime. 90 tablet 3  . levothyroxine (SYNTHROID, LEVOTHROID) 75 MCG tablet Take 1 tablet by mouth daily.    Marland Kitchen losartan (COZAAR) 100 MG tablet TAKE 1 TABLET EVERY DAY 90 tablet 3  . QUEtiapine (SEROQUEL) 50 MG tablet Take 1 tablet (50 mg total) by mouth at bedtime. 90 tablet 1   Facility-Administered Medications Prior to Visit  Medication Dose Route Frequency Provider Last Rate Last Dose  . methylPREDNISolone acetate (DEPO-MEDROL) injection 10.4 mg  10.4 mg Intra-articular Once Sopheap Boehle, Evie Lacks, MD        ROS Review of Systems  Constitutional: Negative for activity change, appetite change, chills, fatigue and unexpected weight change.  HENT: Negative for congestion, mouth sores and sinus pressure.   Eyes: Negative for visual disturbance.  Respiratory: Negative for cough and chest tightness.   Gastrointestinal: Negative for abdominal pain and nausea.  Genitourinary: Negative for difficulty urinating, frequency and vaginal pain.  Musculoskeletal: Negative for back pain and gait problem.  Skin: Negative for pallor and rash.  Neurological: Negative for dizziness, tremors, weakness, numbness and headaches.  Psychiatric/Behavioral:  Positive for behavioral problems, decreased concentration and dysphoric mood. Negative for confusion, self-injury, sleep disturbance and suicidal ideas. The patient is nervous/anxious.     Objective:  BP 132/84 (BP Location: Right Arm, Patient Position: Sitting, Cuff Size: Normal)   Pulse 94   Temp 98.2 F (36.8 C) (Oral)   Ht 5\' 6"  (1.676 m)   Wt 148 lb (67.1 kg)   SpO2 97%   BMI 23.89 kg/m   BP Readings from Last 3 Encounters:  02/24/17 132/84  11/20/16 125/82  09/22/16 (!) 148/84    Wt Readings from Last 3 Encounters:  02/24/17 148 lb (67.1 kg)  11/20/16 149 lb (67.6 kg)  09/22/16 144 lb 12 oz (65.7 kg)    Physical Exam  Constitutional: She appears well-developed. No distress.  HENT:  Head: Normocephalic.  Right Ear: External ear normal.  Left Ear: External ear normal.  Nose: Nose normal.  Mouth/Throat: Oropharynx is clear and moist.  Eyes: Pupils are equal, round, and reactive to light. Conjunctivae are normal. Right eye exhibits no discharge. Left eye exhibits no discharge.  Neck: Normal range of motion. Neck supple. No JVD present. No tracheal deviation present. No thyromegaly present.  Cardiovascular: Normal rate, regular rhythm and normal heart sounds.   Pulmonary/Chest: No stridor. No respiratory distress. She has no wheezes.  Abdominal: Soft. Bowel sounds are normal. She exhibits no distension and no mass. There is no tenderness. There is no rebound and no guarding.  Musculoskeletal: She exhibits no edema or tenderness.  Lymphadenopathy:    She  has no cervical adenopathy.  Neurological: She displays normal reflexes. No cranial nerve deficit. She exhibits normal muscle tone. Coordination normal.  Skin: No rash noted. No erythema.  Psychiatric: She has a normal mood and affect. Her behavior is normal. Judgment and thought content normal.  a/o/c  Lab Results  Component Value Date   WBC 3.9 (L) 11/20/2016   HGB 11.8 (L) 11/20/2016   HCT 36.4 11/20/2016   PLT  62.0 (L) 11/20/2016   GLUCOSE 92 11/20/2016   CHOL 228 (H) 08/16/2014   TRIG 84.0 08/16/2014   HDL 38.10 (L) 08/16/2014   LDLDIRECT 172.4 12/04/2010   LDLCALC 173 (H) 08/16/2014   ALT 24 11/20/2016   AST 23 11/20/2016   NA 139 11/20/2016   K 3.9 11/20/2016   CL 106 11/20/2016   CREATININE 0.87 11/20/2016   BUN 14 11/20/2016   CO2 26 11/20/2016   TSH 2.47 11/20/2016   INR 1.0 ratio 12/10/2009   HGBA1C 5.7 05/28/2010    Ct Head Wo Contrast  Result Date: 08/22/2016 CLINICAL DATA:  Increased memory loss over the past 2 months. Late onset Alzheimer's disease with behavioral disturbances. EXAM: CT HEAD WITHOUT CONTRAST TECHNIQUE: Contiguous axial images were obtained from the base of the skull through the vertex without intravenous contrast. COMPARISON:  None. FINDINGS: Brain: Diffusely enlarged ventricles and subarachnoid spaces. Patchy white matter low density in both cerebral hemispheres. No intracranial hemorrhage, mass lesion or CT evidence of acute infarction. Vascular: No hyperdense vessel or unexpected calcification. Skull: Normal. Negative for fracture or focal lesion. Sinuses/Orbits: No acute finding. Other: None. IMPRESSION: No acute abnormality. Mild to moderate diffuse cerebral and cerebellar atrophy and moderate chronic small vessel white matter ischemic changes in both cerebral hemispheres. Electronically Signed   By: Claudie Revering M.D.   On: 08/22/2016 14:33    Assessment & Plan:   Diagnoses and all orders for this visit:  Estrogen deficiency -     DG Bone Density; Future   I have discontinued [name]'s QUEtiapine. I am also having her maintain her aspirin, cholecalciferol, levothyroxine, losartan, amLODipine, and donepezil. We will continue to administer methylPREDNISolone acetate.  No orders of the defined types were placed in this encounter.    Follow-up: No Follow-up on file.  Walker Kehr, MD

## 2017-02-24 NOTE — Assessment & Plan Note (Addendum)
She states she is seeing Dr Casimiro Needle - he stopped Seroquel and donepezil because she was doing well...  She is seeing him and a therapist regular. I called Dr Plovsky's office and confirmed this.

## 2017-04-21 ENCOUNTER — Telehealth: Payer: Self-pay | Admitting: Internal Medicine

## 2017-04-21 MED ORDER — LEVOTHYROXINE SODIUM 75 MCG PO TABS: 75.0000 ug | ORAL_TABLET | Freq: Every day | ORAL | 5 refills | Status: DC

## 2017-04-21 NOTE — Telephone Encounter (Signed)
Patient is requesting refills on levothyroxine to be sent to [facility] in [place].

## 2017-04-21 NOTE — Telephone Encounter (Signed)
Reviewed chart pt is up-to-date sent refills to pharmacy she requested...[name] [name]

## 2017-04-28 DIAGNOSIS — M25562 Pain in left knee: Secondary | ICD-10-CM | POA: Diagnosis not present

## 2017-04-28 DIAGNOSIS — M1712 Unilateral primary osteoarthritis, left knee: Secondary | ICD-10-CM | POA: Diagnosis not present

## 2017-05-05 DIAGNOSIS — M1712 Unilateral primary osteoarthritis, left knee: Secondary | ICD-10-CM | POA: Diagnosis not present

## 2017-05-05 DIAGNOSIS — M25562 Pain in left knee: Secondary | ICD-10-CM | POA: Diagnosis not present

## 2017-05-12 DIAGNOSIS — M25562 Pain in left knee: Secondary | ICD-10-CM | POA: Diagnosis not present

## 2017-05-12 DIAGNOSIS — M1712 Unilateral primary osteoarthritis, left knee: Secondary | ICD-10-CM | POA: Diagnosis not present

## 2017-05-18 DIAGNOSIS — M545 Low back pain: Secondary | ICD-10-CM | POA: Diagnosis not present

## 2017-05-18 DIAGNOSIS — G8929 Other chronic pain: Secondary | ICD-10-CM | POA: Diagnosis not present

## 2017-05-19 ENCOUNTER — Telehealth: Payer: Self-pay | Admitting: Internal Medicine

## 2017-05-19 NOTE — Telephone Encounter (Signed)
[name], patients son is calling and has some concerns he would like to speak with the nurse about. He is on the [facility]. Patient has an appointment on 10.24. He states his mother is very depressed. Please follow up with him. He states he has issues getting calls back from the office.

## 2017-05-20 NOTE — Telephone Encounter (Signed)
Called son [name]) states that pt is having a hard time. Pt does not get along with her husband. Son is concerned with her health and some safety. They are looking into placing her into an assistive living.

## 2017-05-21 DIAGNOSIS — M48061 Spinal stenosis, lumbar region without neurogenic claudication: Secondary | ICD-10-CM | POA: Diagnosis not present

## 2017-05-21 DIAGNOSIS — M545 Low back pain: Secondary | ICD-10-CM | POA: Diagnosis not present

## 2017-05-21 DIAGNOSIS — M5186 Other intervertebral disc disorders, lumbar region: Secondary | ICD-10-CM | POA: Diagnosis not present

## 2017-05-26 DIAGNOSIS — M1712 Unilateral primary osteoarthritis, left knee: Secondary | ICD-10-CM | POA: Diagnosis not present

## 2017-05-26 DIAGNOSIS — M25562 Pain in left knee: Secondary | ICD-10-CM | POA: Diagnosis not present

## 2017-05-26 DIAGNOSIS — M545 Low back pain: Secondary | ICD-10-CM | POA: Diagnosis not present

## 2017-06-03 ENCOUNTER — Inpatient Hospital Stay: Admission: RE | Admit: 2017-06-03 | Payer: Medicare HMO | Source: Ambulatory Visit

## 2017-06-03 ENCOUNTER — Ambulatory Visit: Payer: Medicare HMO | Admitting: Internal Medicine

## 2017-06-03 DIAGNOSIS — I700 Atherosclerosis of aorta: Secondary | ICD-10-CM | POA: Diagnosis not present

## 2017-06-03 DIAGNOSIS — R7989 Other specified abnormal findings of blood chemistry: Secondary | ICD-10-CM | POA: Diagnosis not present

## 2017-06-03 DIAGNOSIS — R531 Weakness: Secondary | ICD-10-CM | POA: Diagnosis not present

## 2017-06-05 ENCOUNTER — Encounter: Payer: Self-pay | Admitting: Internal Medicine

## 2017-06-05 ENCOUNTER — Ambulatory Visit (INDEPENDENT_AMBULATORY_CARE_PROVIDER_SITE_OTHER): Payer: Medicare HMO | Admitting: Internal Medicine

## 2017-06-05 VITALS — BP 142/88 | HR 79 | Temp 98.2°F | Ht 66.0 in | Wt 148.0 lb

## 2017-06-05 DIAGNOSIS — F3289 Other specified depressive episodes: Secondary | ICD-10-CM | POA: Diagnosis not present

## 2017-06-05 DIAGNOSIS — F0281 Dementia in other diseases classified elsewhere with behavioral disturbance: Secondary | ICD-10-CM

## 2017-06-05 DIAGNOSIS — R55 Syncope and collapse: Secondary | ICD-10-CM

## 2017-06-05 DIAGNOSIS — I10 Essential (primary) hypertension: Secondary | ICD-10-CM

## 2017-06-05 DIAGNOSIS — Z23 Encounter for immunization: Secondary | ICD-10-CM | POA: Diagnosis not present

## 2017-06-05 DIAGNOSIS — F02818 Dementia in other diseases classified elsewhere, unspecified severity, with other behavioral disturbance: Secondary | ICD-10-CM

## 2017-06-05 DIAGNOSIS — G301 Alzheimer's disease with late onset: Secondary | ICD-10-CM

## 2017-06-05 MED ORDER — MIRTAZAPINE 15 MG PO TABS
15.0000 mg | ORAL_TABLET | Freq: Every day | ORAL | 5 refills | Status: DC
Start: 2017-06-05 — End: 2018-09-27

## 2017-06-05 MED ORDER — LORAZEPAM 0.5 MG PO TABS: 0.5000 mg | ORAL_TABLET | Freq: Two times a day (BID) | ORAL | 1 refills | Status: DC | PRN

## 2017-06-05 NOTE — Assessment & Plan Note (Signed)
weak spells w/nausea at times - she would turn pale... She had several ER  Visits (-) w/up. Last - last wk... Pt had labs, chest CT angio

## 2017-06-05 NOTE — Progress Notes (Signed)
Subjective:  Patient ID: [name] [name], female    DOB: 15-Sep-1932  Age: 83 y.o. MRN: [id number]  CC: No chief complaint on file.   HPI [name] [name] presents for hypothyroidism C/o weak spells w/nausea at times - she would turn pale... She had several ER  Visits (-) w/up. Last - last wk... Pt had labs, chest CT angio C/o depression, memory loss C/o poor appetite Comes w/son  Outpatient Medications Prior to Visit  Medication Sig Dispense Refill  . amLODipine (NORVASC) 5 MG tablet TAKE 1 TABLET EVERY DAY 90 tablet 3  . aspirin 81 MG tablet Take 81 mg by mouth daily.      . Cholecalciferol (VITAMIN D3) 1000 UNITS tablet Take 1,000 Units by mouth daily.      Marland Kitchen levothyroxine (SYNTHROID, LEVOTHROID) 75 MCG tablet Take 1 tablet (75 mcg total) by mouth daily. 30 tablet 5  . losartan (COZAAR) 100 MG tablet TAKE 1 TABLET EVERY DAY 90 tablet 3   Facility-Administered Medications Prior to Visit  Medication Dose Route Frequency Provider Last Rate Last Dose  . methylPREDNISolone acetate (DEPO-MEDROL) injection 10.4 mg  10.4 mg Intra-articular Once Plotnikov, Evie Lacks, MD        ROS Review of Systems  Constitutional: Positive for fatigue. Negative for activity change, appetite change, chills and unexpected weight change.  HENT: Negative for congestion, mouth sores and sinus pressure.   Eyes: Negative for visual disturbance.  Respiratory: Negative for cough and chest tightness.   Gastrointestinal: Negative for abdominal pain and nausea.  Genitourinary: Negative for difficulty urinating, frequency and vaginal pain.  Musculoskeletal: Negative for back pain and gait problem.  Skin: Negative for pallor and rash.  Neurological: Negative for dizziness, tremors, weakness, numbness and headaches.  Psychiatric/Behavioral: Positive for decreased concentration. Negative for confusion, sleep disturbance and suicidal ideas. The patient is nervous/anxious.     Objective:  BP (!) 142/88 (BP Location:  Left Arm, Patient Position: Sitting, Cuff Size: Normal)   Pulse 79   Temp 98.2 F (36.8 C) (Oral)   Ht 5\' 6"  (1.676 m)   Wt 148 lb (67.1 kg)   SpO2 99%   BMI 23.89 kg/m   BP Readings from Last 3 Encounters:  06/05/17 (!) 142/88  02/24/17 132/84  11/20/16 125/82    Wt Readings from Last 3 Encounters:  06/05/17 148 lb (67.1 kg)  02/24/17 148 lb (67.1 kg)  11/20/16 149 lb (67.6 kg)    Physical Exam  Constitutional: She appears well-developed. No distress.  HENT:  Head: Normocephalic.  Right Ear: External ear normal.  Left Ear: External ear normal.  Nose: Nose normal.  Mouth/Throat: Oropharynx is clear and moist.  Eyes: Pupils are equal, round, and reactive to light. Conjunctivae are normal. Right eye exhibits no discharge. Left eye exhibits no discharge.  Neck: Normal range of motion. Neck supple. No JVD present. No tracheal deviation present. No thyromegaly present.  Cardiovascular: Normal rate, regular rhythm and normal heart sounds.   Pulmonary/Chest: No stridor. No respiratory distress. She has no wheezes.  Abdominal: Soft. Bowel sounds are normal. She exhibits no distension and no mass. There is no tenderness. There is no rebound and no guarding.  Musculoskeletal: She exhibits no edema or tenderness.  Lymphadenopathy:    She has no cervical adenopathy.  Neurological: She displays normal reflexes. No cranial nerve deficit. She exhibits normal muscle tone. Coordination normal.  Skin: No rash noted. No erythema.  Psychiatric: She has a normal mood and affect. Her behavior is  normal. Thought content normal.  alert, cooperative  Lab Results  Component Value Date   WBC 3.9 (L) 11/20/2016   HGB 11.8 (L) 11/20/2016   HCT 36.4 11/20/2016   PLT 62.0 (L) 11/20/2016   GLUCOSE 92 11/20/2016   CHOL 228 (H) 08/16/2014   TRIG 84.0 08/16/2014   HDL 38.10 (L) 08/16/2014   LDLDIRECT 172.4 12/04/2010   LDLCALC 173 (H) 08/16/2014   ALT 24 11/20/2016   AST 23 11/20/2016   NA 139  11/20/2016   K 3.9 11/20/2016   CL 106 11/20/2016   CREATININE 0.87 11/20/2016   BUN 14 11/20/2016   CO2 26 11/20/2016   TSH 2.47 11/20/2016   INR 1.0 ratio 12/10/2009   HGBA1C 5.7 05/28/2010    Ct Head Wo Contrast  Result Date: 08/22/2016 CLINICAL DATA:  Increased memory loss over the past 2 months. Late onset Alzheimer's disease with behavioral disturbances. EXAM: CT HEAD WITHOUT CONTRAST TECHNIQUE: Contiguous axial images were obtained from the base of the skull through the vertex without intravenous contrast. COMPARISON:  None. FINDINGS: Brain: Diffusely enlarged ventricles and subarachnoid spaces. Patchy white matter low density in both cerebral hemispheres. No intracranial hemorrhage, mass lesion or CT evidence of acute infarction. Vascular: No hyperdense vessel or unexpected calcification. Skull: Normal. Negative for fracture or focal lesion. Sinuses/Orbits: No acute finding. Other: None. IMPRESSION: No acute abnormality. Mild to moderate diffuse cerebral and cerebellar atrophy and moderate chronic small vessel white matter ischemic changes in both cerebral hemispheres. Electronically Signed   By: Claudie Revering M.D.   On: 08/22/2016 14:33    Assessment & Plan:   There are no diagnoses linked to this encounter. I am having Ms. Chaplin maintain her aspirin, cholecalciferol, losartan, amLODipine, and levothyroxine. We will continue to administer methylPREDNISolone acetate.  No orders of the defined types were placed in this encounter.    Follow-up: No Follow-up on file.  Walker Kehr, MD

## 2017-06-05 NOTE — Assessment & Plan Note (Signed)
Stable lately 

## 2017-06-05 NOTE — Assessment & Plan Note (Signed)
BP Readings from Last 3 Encounters:  06/05/17 (!) 142/88  02/24/17 132/84  11/20/16 125/82

## 2017-06-05 NOTE — Assessment & Plan Note (Signed)
Remeron trial Lorazepam for panic attacks RTC 2 mo

## 2017-06-10 DIAGNOSIS — R69 Illness, unspecified: Secondary | ICD-10-CM | POA: Diagnosis not present

## 2017-06-11 ENCOUNTER — Ambulatory Visit (INDEPENDENT_AMBULATORY_CARE_PROVIDER_SITE_OTHER)
Admission: RE | Admit: 2017-06-11 | Discharge: 2017-06-11 | Disposition: A | Discharge status: Home / Self Care | Payer: Medicare HMO | Source: Ambulatory Visit | Attending: Internal Medicine | Admitting: Internal Medicine

## 2017-06-11 DIAGNOSIS — E2839 Other primary ovarian failure: Secondary | ICD-10-CM

## 2017-06-16 ENCOUNTER — Other Ambulatory Visit: Payer: Self-pay | Admitting: Internal Medicine

## 2017-06-18 ENCOUNTER — Ambulatory Visit: Payer: Medicare HMO | Admitting: Internal Medicine

## 2017-08-21 DIAGNOSIS — R6889 Other general symptoms and signs: Secondary | ICD-10-CM | POA: Diagnosis not present

## 2017-08-21 DIAGNOSIS — E039 Hypothyroidism, unspecified: Secondary | ICD-10-CM | POA: Diagnosis not present

## 2017-08-21 DIAGNOSIS — E785 Hyperlipidemia, unspecified: Secondary | ICD-10-CM | POA: Diagnosis not present

## 2017-08-21 DIAGNOSIS — Z6824 Body mass index (BMI) 24.0-24.9, adult: Secondary | ICD-10-CM | POA: Diagnosis not present

## 2017-08-21 DIAGNOSIS — I10 Essential (primary) hypertension: Secondary | ICD-10-CM | POA: Diagnosis not present

## 2017-08-21 DIAGNOSIS — R350 Frequency of micturition: Secondary | ICD-10-CM | POA: Diagnosis not present

## 2017-08-26 ENCOUNTER — Ambulatory Visit: Payer: Medicare HMO | Admitting: Internal Medicine

## 2017-09-03 DIAGNOSIS — I10 Essential (primary) hypertension: Secondary | ICD-10-CM | POA: Diagnosis not present

## 2017-09-04 DIAGNOSIS — Z1389 Encounter for screening for other disorder: Secondary | ICD-10-CM | POA: Diagnosis not present

## 2017-09-04 DIAGNOSIS — F0280 Dementia in other diseases classified elsewhere without behavioral disturbance: Secondary | ICD-10-CM | POA: Diagnosis not present

## 2017-09-04 DIAGNOSIS — F22 Delusional disorders: Secondary | ICD-10-CM | POA: Diagnosis not present

## 2017-09-04 DIAGNOSIS — R350 Frequency of micturition: Secondary | ICD-10-CM | POA: Diagnosis not present

## 2017-09-04 DIAGNOSIS — F331 Major depressive disorder, recurrent, moderate: Secondary | ICD-10-CM | POA: Diagnosis not present

## 2017-09-04 DIAGNOSIS — G309 Alzheimer's disease, unspecified: Secondary | ICD-10-CM | POA: Diagnosis not present

## 2017-09-07 DIAGNOSIS — H538 Other visual disturbances: Secondary | ICD-10-CM | POA: Diagnosis not present

## 2017-09-07 DIAGNOSIS — R55 Syncope and collapse: Secondary | ICD-10-CM | POA: Diagnosis not present

## 2017-09-07 DIAGNOSIS — R6889 Other general symptoms and signs: Secondary | ICD-10-CM | POA: Diagnosis not present

## 2017-10-02 DIAGNOSIS — F22 Delusional disorders: Secondary | ICD-10-CM | POA: Diagnosis not present

## 2017-10-02 DIAGNOSIS — G301 Alzheimer's disease with late onset: Secondary | ICD-10-CM | POA: Diagnosis not present

## 2017-10-02 DIAGNOSIS — I10 Essential (primary) hypertension: Secondary | ICD-10-CM | POA: Diagnosis not present

## 2017-10-02 DIAGNOSIS — F331 Major depressive disorder, recurrent, moderate: Secondary | ICD-10-CM | POA: Diagnosis not present

## 2017-10-27 ENCOUNTER — Other Ambulatory Visit: Payer: Self-pay | Admitting: Internal Medicine

## 2017-10-30 DIAGNOSIS — F22 Delusional disorders: Secondary | ICD-10-CM | POA: Diagnosis not present

## 2017-10-30 DIAGNOSIS — R350 Frequency of micturition: Secondary | ICD-10-CM | POA: Diagnosis not present

## 2017-10-30 DIAGNOSIS — F331 Major depressive disorder, recurrent, moderate: Secondary | ICD-10-CM | POA: Diagnosis not present

## 2017-10-30 DIAGNOSIS — F0280 Dementia in other diseases classified elsewhere without behavioral disturbance: Secondary | ICD-10-CM | POA: Diagnosis not present

## 2017-10-30 DIAGNOSIS — G301 Alzheimer's disease with late onset: Secondary | ICD-10-CM | POA: Diagnosis not present

## 2017-11-09 DIAGNOSIS — S0990XA Unspecified injury of head, initial encounter: Secondary | ICD-10-CM | POA: Diagnosis not present

## 2017-11-09 DIAGNOSIS — R55 Syncope and collapse: Secondary | ICD-10-CM | POA: Diagnosis not present

## 2017-11-09 DIAGNOSIS — Z79899 Other long term (current) drug therapy: Secondary | ICD-10-CM | POA: Diagnosis not present

## 2017-11-09 DIAGNOSIS — S299XXA Unspecified injury of thorax, initial encounter: Secondary | ICD-10-CM | POA: Diagnosis not present

## 2017-11-09 DIAGNOSIS — R51 Headache: Secondary | ICD-10-CM | POA: Diagnosis not present

## 2017-11-09 DIAGNOSIS — S4992XA Unspecified injury of left shoulder and upper arm, initial encounter: Secondary | ICD-10-CM | POA: Diagnosis not present

## 2017-11-09 DIAGNOSIS — E039 Hypothyroidism, unspecified: Secondary | ICD-10-CM | POA: Diagnosis not present

## 2017-11-09 DIAGNOSIS — S42002A Fracture of unspecified part of left clavicle, initial encounter for closed fracture: Secondary | ICD-10-CM | POA: Diagnosis not present

## 2017-11-09 DIAGNOSIS — I10 Essential (primary) hypertension: Secondary | ICD-10-CM | POA: Diagnosis not present

## 2017-11-09 DIAGNOSIS — Z7982 Long term (current) use of aspirin: Secondary | ICD-10-CM | POA: Diagnosis not present

## 2017-11-09 DIAGNOSIS — S42035A Nondisplaced fracture of lateral end of left clavicle, initial encounter for closed fracture: Secondary | ICD-10-CM | POA: Diagnosis not present

## 2017-11-20 DIAGNOSIS — R5383 Other fatigue: Secondary | ICD-10-CM | POA: Diagnosis not present

## 2017-11-20 DIAGNOSIS — R55 Syncope and collapse: Secondary | ICD-10-CM | POA: Diagnosis not present

## 2017-11-20 DIAGNOSIS — F0390 Unspecified dementia without behavioral disturbance: Secondary | ICD-10-CM | POA: Diagnosis not present

## 2017-12-04 DIAGNOSIS — F22 Delusional disorders: Secondary | ICD-10-CM | POA: Diagnosis not present

## 2017-12-04 DIAGNOSIS — F331 Major depressive disorder, recurrent, moderate: Secondary | ICD-10-CM | POA: Diagnosis not present

## 2017-12-04 DIAGNOSIS — G301 Alzheimer's disease with late onset: Secondary | ICD-10-CM | POA: Diagnosis not present

## 2017-12-04 DIAGNOSIS — R5383 Other fatigue: Secondary | ICD-10-CM | POA: Diagnosis not present

## 2017-12-04 DIAGNOSIS — E039 Hypothyroidism, unspecified: Secondary | ICD-10-CM | POA: Diagnosis not present

## 2017-12-04 DIAGNOSIS — E559 Vitamin D deficiency, unspecified: Secondary | ICD-10-CM | POA: Diagnosis not present

## 2018-01-01 DIAGNOSIS — F333 Major depressive disorder, recurrent, severe with psychotic symptoms: Secondary | ICD-10-CM | POA: Diagnosis not present

## 2018-01-01 DIAGNOSIS — I10 Essential (primary) hypertension: Secondary | ICD-10-CM | POA: Diagnosis not present

## 2018-01-01 DIAGNOSIS — G4700 Insomnia, unspecified: Secondary | ICD-10-CM | POA: Diagnosis not present

## 2018-02-05 DIAGNOSIS — G4700 Insomnia, unspecified: Secondary | ICD-10-CM | POA: Diagnosis not present

## 2018-02-05 DIAGNOSIS — F333 Major depressive disorder, recurrent, severe with psychotic symptoms: Secondary | ICD-10-CM | POA: Diagnosis not present

## 2018-02-05 DIAGNOSIS — Z1389 Encounter for screening for other disorder: Secondary | ICD-10-CM | POA: Diagnosis not present

## 2018-02-05 DIAGNOSIS — I10 Essential (primary) hypertension: Secondary | ICD-10-CM | POA: Diagnosis not present

## 2018-02-15 DIAGNOSIS — F333 Major depressive disorder, recurrent, severe with psychotic symptoms: Secondary | ICD-10-CM | POA: Diagnosis not present

## 2018-02-15 DIAGNOSIS — I10 Essential (primary) hypertension: Secondary | ICD-10-CM | POA: Diagnosis not present

## 2018-02-15 DIAGNOSIS — F0281 Dementia in other diseases classified elsewhere with behavioral disturbance: Secondary | ICD-10-CM | POA: Diagnosis not present

## 2018-02-15 DIAGNOSIS — G300 Alzheimer's disease with early onset: Secondary | ICD-10-CM | POA: Diagnosis not present

## 2018-02-15 DIAGNOSIS — F22 Delusional disorders: Secondary | ICD-10-CM | POA: Diagnosis not present

## 2018-03-05 DIAGNOSIS — F333 Major depressive disorder, recurrent, severe with psychotic symptoms: Secondary | ICD-10-CM | POA: Diagnosis not present

## 2018-03-05 DIAGNOSIS — G3184 Mild cognitive impairment, so stated: Secondary | ICD-10-CM | POA: Diagnosis not present

## 2018-03-05 DIAGNOSIS — I10 Essential (primary) hypertension: Secondary | ICD-10-CM | POA: Diagnosis not present

## 2018-03-05 DIAGNOSIS — F22 Delusional disorders: Secondary | ICD-10-CM | POA: Diagnosis not present

## 2018-04-09 DIAGNOSIS — F22 Delusional disorders: Secondary | ICD-10-CM | POA: Diagnosis not present

## 2018-04-09 DIAGNOSIS — I10 Essential (primary) hypertension: Secondary | ICD-10-CM | POA: Diagnosis not present

## 2018-04-09 DIAGNOSIS — F333 Major depressive disorder, recurrent, severe with psychotic symptoms: Secondary | ICD-10-CM | POA: Diagnosis not present

## 2018-04-09 DIAGNOSIS — G3184 Mild cognitive impairment, so stated: Secondary | ICD-10-CM | POA: Diagnosis not present

## 2018-05-10 DIAGNOSIS — I10 Essential (primary) hypertension: Secondary | ICD-10-CM | POA: Diagnosis not present

## 2018-05-10 DIAGNOSIS — F333 Major depressive disorder, recurrent, severe with psychotic symptoms: Secondary | ICD-10-CM | POA: Diagnosis not present

## 2018-05-10 DIAGNOSIS — F22 Delusional disorders: Secondary | ICD-10-CM | POA: Diagnosis not present

## 2018-05-28 ENCOUNTER — Ambulatory Visit (HOSPITAL_COMMUNITY): Payer: Medicare HMO | Admitting: Psychiatry

## 2018-06-01 DIAGNOSIS — F22 Delusional disorders: Secondary | ICD-10-CM | POA: Diagnosis not present

## 2018-06-01 DIAGNOSIS — Z23 Encounter for immunization: Secondary | ICD-10-CM | POA: Diagnosis not present

## 2018-06-01 DIAGNOSIS — F5104 Psychophysiologic insomnia: Secondary | ICD-10-CM | POA: Diagnosis not present

## 2018-06-08 DIAGNOSIS — M545 Low back pain: Secondary | ICD-10-CM | POA: Diagnosis not present

## 2018-06-14 DIAGNOSIS — M545 Low back pain: Secondary | ICD-10-CM | POA: Diagnosis not present

## 2018-06-14 DIAGNOSIS — M48061 Spinal stenosis, lumbar region without neurogenic claudication: Secondary | ICD-10-CM | POA: Diagnosis not present

## 2018-07-21 ENCOUNTER — Ambulatory Visit (INDEPENDENT_AMBULATORY_CARE_PROVIDER_SITE_OTHER): Payer: Medicare HMO | Admitting: Psychiatry

## 2018-07-21 ENCOUNTER — Encounter

## 2018-07-21 DIAGNOSIS — F329 Major depressive disorder, single episode, unspecified: Secondary | ICD-10-CM

## 2018-07-21 DIAGNOSIS — F0390 Unspecified dementia without behavioral disturbance: Secondary | ICD-10-CM

## 2018-07-21 DIAGNOSIS — F0393 Unspecified dementia, unspecified severity, with mood disturbance: Secondary | ICD-10-CM

## 2018-07-21 MED ORDER — OLANZAPINE 5 MG PO TABS: ORAL_TABLET | ORAL | 2 refills | Status: DC

## 2018-07-21 NOTE — Progress Notes (Signed)
Psychiatric Initial Adult Assessment   Patient Identification: [name] [name] MRN:  [id number] Date of Evaluation:  07/21/2018 Referral Source: Dr. Chipper Oman Chief Complaint:   Visit Diagnosis: Dementia  History of Present Illness:  This patient is an 84 year old white married mother with no past psychiatric history and is seen together with her son [name] [name].  The patient was asked to be seen because of persistent auditory hallucinations.  This patient is married for over 20 years and a dysfunctional marriage.  Apparently her husband was unfaithful for many years.  She has problems communicating and negotiating with him.  She certainly does not trust him nor does she respected.  The patient has 2 adult sons and some grandchildren all of whom are doing fairly well.  The patient's major caregiver is her son [name] [name] who unfortunately is not here today.  The patient no longer does any of her institutional ADLs.  She continues to do most of her basic ADLs.  Her health seems to be stable.  She herself denies daily depression.  The patient cannot describe anything that she does for enjoyment.  When pressed she does enjoy seeing her grandchildren.  She claims she is sleeping and eating well.  She is got a reasonable amount of energy.  She does admit that she is having problems concentrating and having memory difficulties.  She denies feeling worthless.  She is not suspicious nor she is somatic.  She denies being suicidal now or ever.  The patient denies the use of alcohol.  She describes chronic auditory hallucinations hearing multiple voices talking to each other.  The voices never address her.  They never command her to do anything.  She has no visual hallucinations.  She is not paranoid.  The patient does admit that many months ago she did have a period where she was persistently depressed and remembers that she was not sleeping well at that time.  She denies ever having manic symptoms.  She denies symptoms of  generalized anxiety disorder, panic disorder or obsessive-compulsive disorder. Patient has never been in a psychiatric hospital.  She has never met a psychiatrist before today.  She is never been in therapy.  At some point 1 of her primary care doctors placed her on high-dose Zyprexa 10 mg.  It is noted by her son that she has had multiple falls. The patient has hypertension and thyroid disease.  She had a CAT scan of her head 1 year ago which essentially was not contributing.  It showed no evidence of stroke or tumor.  It showed diffuse cardiovascular evidence.  The patient has a high school degree.  Today she had a Mini-Mental status exam.  Her estimated score should have been approximately 26.  Her score today was approximately 13.  The patient was disoriented to date and could not remember any words after 5 minutes.  It should be noted the patient is not bothered by her auditory hallucinations.  On the other hand the patient admits to feeling bored and lonely.  This together with what might appear is anhedonia might suggest that she has clinical depression.   Associated Signs/Symptoms: Depression Symptoms:  depressed mood, (Hypo) Manic Symptoms:  Hallucinations, Anxiety Symptoms:   Psychotic Symptoms:  Hallucinations: Auditory PTSD Symptoms:   Past Psychiatric History: Zyprexa 10 mg, Zoloft unclear dose, history of Remeron Previous Psychotropic Medications: Yes   Substance Abuse History in the last 12 months:  No.  Consequences of Substance Abuse:   Past Medical History:  Past  Medical History:  Diagnosis Date  . Adenomatous colon polyp 05/2002  . Anxiety   . Arthritis    psoriatic  . Deviated septum   . Fatty liver disease, nonalcoholic   . Hepatic cyst   . HTN (hypertension)   . Hyperlipidemia   . Hypothyroidism   . Irritable bladder   . Pelvic fracture ([place]) 9/14   R  . Platelets decreased ([place]) 2003   low since 2003, Dr Ralene Ok  . WBC decreased 2003   low since 2003,  Dr Ralene Ok    Past Surgical History:  Procedure Laterality Date  . BLADDER REPAIR    . NASAL SEPTUM SURGERY    . VAGINAL HYSTERECTOMY      Family Psychiatric History:   Family History:  Family History  Problem Relation Age of Onset  . Hypertension Mother   . Cirrhosis Sister        elevated LFTs  . Heart disease Mother   . Heart disease Father   . Hypertension Father     Social History:   Social History   Socioeconomic History  . Marital status: Married    Spouse name: Not on file  . Number of children: 2  . Years of education: Not on file  . Highest education level: Not on file  Occupational History  . Occupation: Retired  Scientific laboratory technician  . Financial resource strain: Not on file  . Food insecurity:    Worry: Not on file    Inability: Not on file  . Transportation needs:    Medical: Not on file    Non-medical: Not on file  Tobacco Use  . Smoking status: Never Smoker  . Smokeless tobacco: Never Used  Substance and Sexual Activity  . Alcohol use: No    Alcohol/week: 0.0 standard drinks  . Drug use: No  . Sexual activity: Never  Lifestyle  . Physical activity:    Days per week: Not on file    Minutes per session: Not on file  . Stress: Not on file  Relationships  . Social connections:    Talks on phone: Not on file    Gets together: Not on file    Attends religious service: Not on file    Active member of club or organization: Not on file    Attends meetings of clubs or organizations: Not on file    Relationship status: Not on file  Other Topics Concern  . Not on file  Social History Narrative   Married Alcoholic Husband, she moved out in 2011.   Daily Caffeine Use - 3    Additional Social History:   Allergies:   Allergies  Allergen Reactions  . Butalbital-Apap-Caffeine     REACTION: not well  . Citalopram Hydrobromide     REACTION: elev LFTs  . Codeine Sulfate     REACTION: Nausea/vomiting  . Meloxicam     Upset stomach   . Nortriptyline  Hcl     REACTION: nightmares  . Prevnar [Pneumococcal 13-Val Conj Vacc] Nausea And Vomiting    Metabolic Disorder Labs: Lab Results  Component Value Date   HGBA1C 5.7 05/28/2010   No results found for: PROLACTIN Lab Results  Component Value Date   CHOL 228 (H) 08/16/2014   TRIG 84.0 08/16/2014   HDL 38.10 (L) 08/16/2014   CHOLHDL 6 08/16/2014   VLDL 16.8 08/16/2014   LDLCALC 173 (H) 08/16/2014   LDLCALC 157 (H) 03/30/2014   Lab Results  Component Value Date   TSH  2.47 11/20/2016    Therapeutic Level Labs: No results found for: LITHIUM No results found for: CBMZ No results found for: VALPROATE  Current Medications: Current Outpatient Medications  Medication Sig Dispense Refill  . amLODipine (NORVASC) 5 MG tablet TAKE 1 TABLET EVERY DAY 90 tablet 3  . aspirin 81 MG tablet Take 81 mg by mouth daily.      . Cholecalciferol (VITAMIN D3) 1000 UNITS tablet Take 1,000 Units by mouth daily.      Marland Kitchen levothyroxine (SYNTHROID, LEVOTHROID) 75 MCG tablet TAKE ONE TABLET BY MOUTH ONCE DAILY 30 tablet 5  . LORazepam (ATIVAN) 0.5 MG tablet Take 1 tablet (0.5 mg total) by mouth 2 (two) times daily as needed for anxiety (anxiety, panic attack). 30 tablet 1  . losartan (COZAAR) 100 MG tablet TAKE 1 TABLET EVERY DAY 90 tablet 3  . mirtazapine (REMERON) 15 MG tablet Take 1 tablet (15 mg total) by mouth daily before supper. 30 tablet 5   Current Facility-Administered Medications  Medication Dose Route Frequency Provider Last Rate Last Dose  . methylPREDNISolone acetate (DEPO-MEDROL) injection 10.4 mg  10.4 mg Intra-articular Once Plotnikov, Evie Lacks, MD        Musculoskeletal: Strength & Muscle Tone: within normal limits Gait & Station: normal Patient leans: Normal  Psychiatric Specialty Exam: ROS  There were no vitals taken for this visit.There is no height or weight on file to calculate BMI.  General Appearance: Casual  Eye Contact:  Good  Speech:  Clear and Coherent  Volume:   Normal  Mood:  Depressed  Affect:  Congruent  Thought Process:  Coherent  Orientation: Disoriented to date  Thought Content:  Logical  Suicidal Thoughts:  No  Homicidal Thoughts:  No  Memory:  Recent;   Poor Remote;   Poor  Judgement:  Fair  Insight:  Lacking  Psychomotor Activity:  Decreased  Concentration:    Recall:  Poor  Fund of Knowledge:Fair  Language: Fair  Akathisia:  No  Handed:  Right  AIMS (if indicated):  not done  Assets:  Communication Skills  ADL's:  Intact  Cognition: Impaired,  Moderate  Sleep:  Good   Screenings: PHQ2-9     Office Visit from 11/20/2016 in [place] from 03/20/2015 in [facility]  PHQ-2 Total Score  1  0      Assessment and Plan:   At this time the patient's major problem seems to be that of memory disorder.  She shows a moderate degree of memory loss consistent with Alzheimer's dementia.  It should be noted that she had a CAT scan approximately a year ago that showed no tumor or stroke.  It is noted also that she was tried on Aricept for reasons that are not clear it was stopped.  Unfortunately [name] [name] her caregiver would be the best person to ask these questions of, is not here today.  What is clear however she is on a high dose of Zyprexa.  The patient has no true distress from her auditory hallucinations.  That is the least by her report today.  I am interested to hearing how [name] [name] experiences her voices.  In other words does the patient really complained to him about the does not complain today.  At this time what is clinically significant is the patient's had a number of falls.  With this high dose of Zyprexa I think this is very likely related to the Zyprexa.  At this time  we will go ahead and reduce Zyprexa from a dose of 10 mg down to 5 mg.  I am interested in giving her an acetylcholinesterase inhibitor.  The possibility of using Exelon will be considered when she returns to  see me in about 2 months.  The patient is not suicidal.  In this setting she is in with the support she is getting from her family she actually is functioning fairly well.  Today he was evident that her memory is significantly impaired.  The issue of does she have superimposed clinical depression is still a question.  While she denies overt depression it seems that she does appear to have anhedonia.  It is not clear if this is not truly lethargy/apathy that is related to her memory disorder.  It is relevant that she feels bored and lonely.  I do not believe that she is happy with her life at this time.  It is possible that a superimposed depression is worsening her cognitive capacity.  At this time she will continue taking Zoloft for unclear of the dose at this time.  When she returns we will make an effort to start an acetylcholinesterase inhibitor and consider increasing her Zoloft.  Also probably make an effort to taper and discontinue her Zyprexa.  Jerral Ralph, MD 12/11/20193:07 PM

## 2018-07-26 DIAGNOSIS — F22 Delusional disorders: Secondary | ICD-10-CM | POA: Diagnosis not present

## 2018-07-26 DIAGNOSIS — F333 Major depressive disorder, recurrent, severe with psychotic symptoms: Secondary | ICD-10-CM | POA: Diagnosis not present

## 2018-07-26 DIAGNOSIS — N390 Urinary tract infection, site not specified: Secondary | ICD-10-CM | POA: Diagnosis not present

## 2018-07-26 DIAGNOSIS — I10 Essential (primary) hypertension: Secondary | ICD-10-CM | POA: Diagnosis not present

## 2018-07-26 DIAGNOSIS — G301 Alzheimer's disease with late onset: Secondary | ICD-10-CM | POA: Diagnosis not present

## 2018-08-03 ENCOUNTER — Other Ambulatory Visit: Payer: Self-pay | Admitting: Internal Medicine

## 2018-09-01 ENCOUNTER — Ambulatory Visit (HOSPITAL_COMMUNITY): Payer: Medicare HMO | Admitting: Psychiatry

## 2018-09-01 ENCOUNTER — Encounter (HOSPITAL_COMMUNITY): Payer: Self-pay | Admitting: Psychiatry

## 2018-09-01 VITALS — BP 154/81 | HR 84 | Ht 66.0 in | Wt 162.0 lb

## 2018-09-01 DIAGNOSIS — G309 Alzheimer's disease, unspecified: Secondary | ICD-10-CM | POA: Diagnosis not present

## 2018-09-01 DIAGNOSIS — F0280 Dementia in other diseases classified elsewhere without behavioral disturbance: Secondary | ICD-10-CM | POA: Diagnosis not present

## 2018-09-01 DIAGNOSIS — F329 Major depressive disorder, single episode, unspecified: Secondary | ICD-10-CM | POA: Diagnosis not present

## 2018-09-01 NOTE — Progress Notes (Signed)
Psychiatric Initial Adult Assessment   Patient Identification: [name] [name] MRN:  [id number] Date of Evaluation:  09/01/2018 Referral Source: Dr. Chipper Oman Chief Complaint:   Visit Diagnosis: Dementia with depression Chief complaint "I am bored"  History of Present Illness:  Today this patient is seen with her son [name] who actually spends more time with her that her other son.  The patient really has not changed much at all.  She lives a very sedentary lifestyle.  She is more of a state of apathy.  There are few distinct signs of clinical depression in this patient at this time.  She does not chronic.  She does not complain of being depressed.  I think she is not anhedonic as much as she is apathetic.  She is sleeping somewhat erratically but according to [name] this might be related to the fact that she is just been diagnosed and being treated for a urinary tract infection.  The patient continued actually to eat well.  In fact her appetite is improved.  Her energy level is fair.  She continues to describe auditory visual hallucinations related to women and men coming in her home.  This is been going on for quite a while and at this time the patient seems to be desensitized to it.  We reduced her Zyprexa from 10 to 5 mg and it has not changed.  Fortunately the patient has not fallen.  To be clear she is actually not on Zoloft.  She is not on any antidepressant at this time.  The patient is not suicidal.  She drinks no alcohol.  She has no signs of of neurological problems.  She denies any symptoms of weakness one side of her body or the other.  Today we thoroughly reviewed her CAT scan and all her labs.  Is noted that her TSH and B12 are all within normal limits.  It is noted that her comprehensive metabolic panel is all within normal limits.  All of this indicative of a person who likely is experiencing Alzheimer's dementia.  We reviewed all her labs with them and shared that they were essentially normal.   The patient really does not describe fatigue.  Is mainly apathy.   Associated Signs/Symptoms: Depression Symptoms:  depressed mood, (Hypo) Manic Symptoms:  Hallucinations, Anxiety Symptoms:   Psychotic Symptoms:  Hallucinations: Auditory PTSD Symptoms:   Past Psychiatric History: Zyprexa 10 mg, Zoloft unclear dose, history of Remeron Previous Psychotropic Medications: Yes   Substance Abuse History in the last 12 months:  No.  Consequences of Substance Abuse:   Past Medical History:  Past Medical History:  Diagnosis Date  . Adenomatous colon polyp 05/2002  . Anxiety   . Arthritis    psoriatic  . Deviated septum   . Fatty liver disease, nonalcoholic   . Hepatic cyst   . HTN (hypertension)   . Hyperlipidemia   . Hypothyroidism   . Irritable bladder   . Pelvic fracture ([place]) 9/14   R  . Platelets decreased ([place]) 2003   low since 2003, Dr Ralene Ok  . WBC decreased 2003   low since 2003, Dr Ralene Ok    Past Surgical History:  Procedure Laterality Date  . BLADDER REPAIR    . NASAL SEPTUM SURGERY    . VAGINAL HYSTERECTOMY      Family Psychiatric History:   Family History:  Family History  Problem Relation Age of Onset  . Hypertension Mother   . Cirrhosis Sister  elevated LFTs  . Heart disease Mother   . Heart disease Father   . Hypertension Father     Social History:   Social History   Socioeconomic History  . Marital status: Married    Spouse name: Not on file  . Number of children: 2  . Years of education: Not on file  . Highest education level: Not on file  Occupational History  . Occupation: Retired  Scientific laboratory technician  . Financial resource strain: Not on file  . Food insecurity:    Worry: Not on file    Inability: Not on file  . Transportation needs:    Medical: Not on file    Non-medical: Not on file  Tobacco Use  . Smoking status: Never Smoker  . Smokeless tobacco: Never Used  Substance and Sexual Activity  . Alcohol use: No     Alcohol/week: 0.0 standard drinks  . Drug use: No  . Sexual activity: Never  Lifestyle  . Physical activity:    Days per week: Not on file    Minutes per session: Not on file  . Stress: Not on file  Relationships  . Social connections:    Talks on phone: Not on file    Gets together: Not on file    Attends religious service: Not on file    Active member of club or organization: Not on file    Attends meetings of clubs or organizations: Not on file    Relationship status: Not on file  Other Topics Concern  . Not on file  Social History Narrative   Married Alcoholic Husband, she moved out in 2011.   Daily Caffeine Use - 3    Additional Social History:   Allergies:   Allergies  Allergen Reactions  . Butalbital-Apap-Caffeine     REACTION: not well  . Citalopram Hydrobromide     REACTION: elev LFTs  . Codeine Sulfate     REACTION: Nausea/vomiting  . Meloxicam     Upset stomach   . Nortriptyline Hcl     REACTION: nightmares  . Prevnar [Pneumococcal 13-Val Conj Vacc] Nausea And Vomiting    Metabolic Disorder Labs: Lab Results  Component Value Date   HGBA1C 5.7 05/28/2010   No results found for: PROLACTIN Lab Results  Component Value Date   CHOL 228 (H) 08/16/2014   TRIG 84.0 08/16/2014   HDL 38.10 (L) 08/16/2014   CHOLHDL 6 08/16/2014   VLDL 16.8 08/16/2014   LDLCALC 173 (H) 08/16/2014   LDLCALC 157 (H) 03/30/2014   Lab Results  Component Value Date   TSH 2.47 11/20/2016    Therapeutic Level Labs: No results found for: LITHIUM No results found for: CBMZ No results found for: VALPROATE  Current Medications: Current Outpatient Medications  Medication Sig Dispense Refill  . amLODipine (NORVASC) 5 MG tablet TAKE 1 TABLET EVERY DAY 90 tablet 3  . aspirin 81 MG tablet Take 81 mg by mouth daily.      . Cholecalciferol (VITAMIN D3) 1000 UNITS tablet Take 1,000 Units by mouth daily.      Marland Kitchen levothyroxine (SYNTHROID, LEVOTHROID) 75 MCG tablet TAKE ONE TABLET BY  MOUTH ONCE DAILY 30 tablet 5  . LORazepam (ATIVAN) 0.5 MG tablet Take 1 tablet (0.5 mg total) by mouth 2 (two) times daily as needed for anxiety (anxiety, panic attack). 30 tablet 1  . losartan (COZAAR) 100 MG tablet TAKE 1 TABLET EVERY DAY 90 tablet 3  . mirtazapine (REMERON) 15 MG tablet Take 1 tablet (  15 mg total) by mouth daily before supper. 30 tablet 5  . OLANZapine (ZYPREXA) 5 MG tablet 1  qhs 30 tablet 2   Current Facility-Administered Medications  Medication Dose Route Frequency Provider Last Rate Last Dose  . methylPREDNISolone acetate (DEPO-MEDROL) injection 10.4 mg  10.4 mg Intra-articular Once Plotnikov, Evie Lacks, MD        Musculoskeletal: Strength & Muscle Tone: within normal limits Gait & Station: normal Patient leans: Normal  Psychiatric Specialty Exam: ROS  Blood pressure (!) 154/81, pulse 84, height 5\' 6"  (1.676 m), weight 162 lb (73.5 kg), SpO2 95 %.Body mass index is 26.15 kg/m.  General Appearance: Casual  Eye Contact:  Good  Speech:  Clear and Coherent  Volume:  Normal  Mood:  Depressed  Affect:  Congruent  Thought Process:  Coherent  Orientation: Disoriented to date  Thought Content:  Logical  Suicidal Thoughts:  No  Homicidal Thoughts:  No  Memory:  Recent;   Poor Remote;   Poor  Judgement:  Fair  Insight:  Lacking  Psychomotor Activity:  Decreased  Concentration:    Recall:  Poor  Fund of Knowledge:Fair  Language: Fair  Akathisia:  No  Handed:  Right  AIMS (if indicated):  not done  Assets:  Communication Skills  ADL's:  Intact  Cognition: Impaired,  Moderate  Sleep:  Good   Screenings: PHQ2-9     Office Visit from 11/20/2016 in [facility] from 03/20/2015 in [facility]  PHQ-2 Total Score  1  0      Assessment and Plan:  This patient has a number of problems.  The first is that she has a memory impairment.  On her last visit we performed a Mini-Mental status exam and she  scored an abnormally low score of 13.  For this problem the important thing is she has an appointment with a neurologist in 1 week.  At that time I suspect they will review our note and consider the possibility of using Exelon patch.  The patient tried Aricept apparently had problems with it in the past.  Her second problem is auditory hallucinations.  I do not think they are that distressing her that dysfunctional.  The patient says she is used to.  At this time we will discontinue her Zyprexa as it is clearly a risk of a fall.  Patient continue her treatment for bladder infection.  Patient will keep her neurology appointment and when she returns she will be off of the Zyprexa.  Her son once again bring her updated medication list and return with her in 2 months.  At this time the patient is not suicidal.  She is functioning fairly well.  She has very good family support.  At this time the patient still is going through a diagnostic process that is going to involve a neurologist.  I do believe she is already had a work-up and is consistent for Alzheimer's dementia.  At this time I find no clear evidence that her depression is distinctly that much different from a dementing process.  Her diagnostic process will continue when she returns to see me in 2 months after seeing the neurologist.  Jerral Ralph, MD 1/22/20202:19 PM

## 2018-09-27 ENCOUNTER — Encounter: Payer: Self-pay | Admitting: Neurology

## 2018-09-27 ENCOUNTER — Ambulatory Visit: Payer: Medicare HMO | Admitting: Neurology

## 2018-09-27 VITALS — BP 138/82 | HR 75 | Ht 66.0 in | Wt 166.0 lb

## 2018-09-27 DIAGNOSIS — F03918 Unspecified dementia, unspecified severity, with other behavioral disturbance: Secondary | ICD-10-CM | POA: Insufficient documentation

## 2018-09-27 DIAGNOSIS — F0391 Unspecified dementia with behavioral disturbance: Secondary | ICD-10-CM | POA: Diagnosis not present

## 2018-09-27 MED ORDER — MEMANTINE HCL 10 MG PO TABS: 10.0000 mg | ORAL_TABLET | Freq: Two times a day (BID) | ORAL | 4 refills | Status: DC

## 2018-09-27 MED ORDER — MEMANTINE HCL 10 MG PO TABS: 10.0000 mg | ORAL_TABLET | Freq: Two times a day (BID) | ORAL | 11 refills | Status: DC

## 2018-09-27 NOTE — Progress Notes (Signed)
PATIENT: [name] [name] DOB: November 29, 1932  Chief Complaint  Patient presents with  . Memory Loss    MMSE 19/30 - 4 animals.  She is here with her son, [name], to have her memory loss and hallucinations evaluated.   Marland Kitchen PCP    Garwin Brothers, MD (new PCP)     HISTORICAL   [name] [name] is a 85 years old female, accompanied by her son [name], seen in request by her primary care physician Dr. Reesa Chew, Marlene Lard for evaluation of memory loss, hallucinations, initial evaluation was on September 27, 2018.  I have reviewed and summarized the referring note from the referring physician.  She had a past medical history of hypothyroidism, on supplement, hypertension,  She lives with her husband, had a strained relationship for many years, she is a retired Scientist, clinical (histocompatibility and immunogenetics), denies family history of dementia,  Over the past few years, she was noted to have gradual onset memory loss, misplace her card, hearing aids, gradually getting worse, eventually was forced to stop driving in 6962.  She also developed delusional idea, thinking her husband bring woman to their home, was treated by a psychiatrist over the past 1 year, was put on Zyprexa, which helped her some, recent decrease dose from 10 mg to 5 mg every night has decreased side effect of excessive daytime sleepiness,  Now she has very sedentary lifestyle, no longer enjoying cooking, sitting in her chair all day long, worsening confusion delusional idea since her UTI,  I personally reviewed CT head in April 2019 mild generalized atrophy, no acute abnormality.  Laboratory evaluations in April 2018 showed normal BMP, CBC showed hemoglobin of 11.8, normal TSH, B12,   REVIEW OF SYSTEMS: Full 14 system review of systems performed and notable only for memory loss, insomnia, sleepiness, joint pain, depression All other review of systems were negative.  ALLERGIES: Allergies  Allergen Reactions  . Butalbital-Apap-Caffeine     REACTION: not well  . Citalopram  Hydrobromide     REACTION: elev LFTs  . Codeine Sulfate     REACTION: Nausea/vomiting  . Meloxicam     Upset stomach   . Nortriptyline Hcl     REACTION: nightmares  . Prevnar [Pneumococcal 13-Val Conj Vacc] Nausea And Vomiting    HOME MEDICATIONS: Current Outpatient Medications  Medication Sig Dispense Refill  . amLODipine (NORVASC) 5 MG tablet TAKE 1 TABLET EVERY DAY 90 tablet 3  . aspirin 81 MG tablet Take 81 mg by mouth daily.      . B Complex Vitamins (B COMPLEX PO) Take 1 tablet by mouth daily.    . Coenzyme Q10 (CO Q 10 PO) Take 1 tablet by mouth daily.    Marland Kitchen levothyroxine (SYNTHROID, LEVOTHROID) 75 MCG tablet TAKE ONE TABLET BY MOUTH ONCE DAILY 30 tablet 5  . memantine (NAMENDA) 5 MG tablet Take 5 mg by mouth daily.    Marland Kitchen MYRBETRIQ 50 MG TB24 tablet Take 50 mg by mouth daily.    Marland Kitchen OLANZapine (ZYPREXA) 5 MG tablet 1  qhs 30 tablet 2  . sertraline (ZOLOFT) 50 MG tablet Take 50 mg by mouth daily.     No current facility-administered medications for this visit.     PAST MEDICAL HISTORY: Past Medical History:  Diagnosis Date  . Adenomatous colon polyp 05/2002  . Anxiety   . Arthritis    psoriatic  . Deviated septum   . Fatty liver disease, nonalcoholic   . Hard of hearing   . Hepatic cyst   .  HTN (hypertension)   . Hyperlipidemia   . Hypothyroidism   . Irritable bladder   . Memory loss   . Pelvic fracture ([place]) 9/14   R  . Platelets decreased ([place]) 2003   low since 2003, Dr Ralene Ok  . WBC decreased 2003   low since 2003, Dr Ralene Ok    PAST SURGICAL HISTORY: Past Surgical History:  Procedure Laterality Date  . BLADDER REPAIR    . NASAL SEPTUM SURGERY    . VAGINAL HYSTERECTOMY      FAMILY HISTORY: Family History  Problem Relation Age of Onset  . Hypertension Mother   . Heart disease Mother   . Heart disease Father   . Hypertension Father   . Cirrhosis Sister        elevated LFTs    SOCIAL HISTORY: Social History   Socioeconomic History  .  Marital status: Married    Spouse name: Not on file  . Number of children: 2  . Years of education: 41  . Highest education level: High school graduate  Occupational History  . Occupation: Retired  Scientific laboratory technician  . Financial resource strain: Not on file  . Food insecurity:    Worry: Not on file    Inability: Not on file  . Transportation needs:    Medical: Not on file    Non-medical: Not on file  Tobacco Use  . Smoking status: Never Smoker  . Smokeless tobacco: Never Used  Substance and Sexual Activity  . Alcohol use: No    Alcohol/week: 0.0 standard drinks  . Drug use: No  . Sexual activity: Never  Lifestyle  . Physical activity:    Days per week: Not on file    Minutes per session: Not on file  . Stress: Not on file  Relationships  . Social connections:    Talks on phone: Not on file    Gets together: Not on file    Attends religious service: Not on file    Active member of club or organization: Not on file    Attends meetings of clubs or organizations: Not on file    Relationship status: Not on file  . Intimate partner violence:    Fear of current or ex partner: Not on file    Emotionally abused: Not on file    Physically abused: Not on file    Forced sexual activity: Not on file  Other Topics Concern  . Not on file  Social History Narrative   Lives at home with husband (full time) and son (part time).   1 cup caffeine per day.   Right-handed.     PHYSICAL EXAM   Vitals:   09/27/18 1507  BP: 138/82  Pulse: 75  Weight: 166 lb (75.3 kg)  Height: 5\' 6"  (1.676 m)    Not recorded      Body mass index is 26.79 kg/m.  PHYSICAL EXAMNIATION:  Gen: NAD, conversant, well nourised, obese, well groomed                     Cardiovascular: Regular rate rhythm, no peripheral edema, warm, nontender. Eyes: Conjunctivae clear without exudates or hemorrhage Neck: Supple, no carotid bruits. Pulmonary: Clear to auscultation bilaterally   NEUROLOGICAL  EXAM:  MMSE - Mini Mental State Exam 09/27/2018  Orientation to time 0  Orientation to Place 3  Registration 3  Attention/ Calculation 4  Recall 0  Language- name 2 objects 2  Language- repeat 1  Language- follow 3  step command 3  Language- read & follow direction 1  Write a sentence 1  Copy design 1  Total score 19  animal naming 4    CRANIAL NERVES: CN II: Visual fields are full to confrontation. Fundoscopic exam is normal with sharp discs and no vascular changes. Pupils are round equal and briskly reactive to light. CN III, IV, VI: extraocular movement are normal. No ptosis. CN V: Facial sensation is intact to pinprick in all 3 divisions bilaterally. Corneal responses are intact.  CN VII: Face is symmetric with normal eye closure and smile. CN VIII: Hearing is normal to rubbing fingers CN IX, X: Palate elevates symmetrically. Phonation is normal. CN XI: Head turning and shoulder shrug are intact CN XII: Tongue is midline with normal movements and no atrophy.  MOTOR: There is no pronator drift of out-stretched arms. Muscle bulk and tone are normal. Muscle strength is normal.  REFLEXES: Reflexes are 2+ and symmetric at the biceps, triceps, knees, and ankles. Plantar responses are flexor.  SENSORY: Intact to light touch, pinprick, positional sensation and vibratory sensation are intact in fingers and toes.  COORDINATION: Rapid alternating movements and fine finger movements are intact. There is no dysmetria on finger-to-nose and heel-knee-shin.    GAIT/STANCE: Posture is normal. Gait is steady with normal steps, base, arm swing, and turning Romberg is absent.   DIAGNOSTIC DATA (LABS, IMAGING, TESTING) - I reviewed patient records, labs, notes, testing and imaging myself where available.   ASSESSMENT AND PLAN  [name] [name] is a 85 y.o. female   Dementia with behavior change  Keep current dose of Zyprexa 5 mg every night  Increase Namenda to 10 mg twice a  day  Return to clinic with nurse practitioner Sarah in 6 months   Marcial Pacas, M.D. Ph.D.  [facility] [address] [place], [place] [address] Ph: [phone] Fax: [phone]  CC: Garwin Brothers, MD

## 2018-09-29 DIAGNOSIS — R350 Frequency of micturition: Secondary | ICD-10-CM | POA: Diagnosis not present

## 2018-09-29 DIAGNOSIS — R410 Disorientation, unspecified: Secondary | ICD-10-CM | POA: Diagnosis not present

## 2018-09-29 DIAGNOSIS — F0390 Unspecified dementia without behavioral disturbance: Secondary | ICD-10-CM | POA: Diagnosis not present

## 2018-09-29 DIAGNOSIS — B372 Candidiasis of skin and nail: Secondary | ICD-10-CM | POA: Diagnosis not present

## 2018-10-05 DIAGNOSIS — I10 Essential (primary) hypertension: Secondary | ICD-10-CM | POA: Diagnosis not present

## 2018-10-05 DIAGNOSIS — F333 Major depressive disorder, recurrent, severe with psychotic symptoms: Secondary | ICD-10-CM | POA: Diagnosis not present

## 2018-10-05 DIAGNOSIS — F0391 Unspecified dementia with behavioral disturbance: Secondary | ICD-10-CM | POA: Diagnosis not present

## 2018-12-01 ENCOUNTER — Ambulatory Visit (HOSPITAL_COMMUNITY): Payer: Medicare HMO | Admitting: Psychiatry

## 2019-01-17 DIAGNOSIS — F333 Major depressive disorder, recurrent, severe with psychotic symptoms: Secondary | ICD-10-CM | POA: Diagnosis not present

## 2019-01-17 DIAGNOSIS — E039 Hypothyroidism, unspecified: Secondary | ICD-10-CM | POA: Diagnosis not present

## 2019-01-17 DIAGNOSIS — I10 Essential (primary) hypertension: Secondary | ICD-10-CM | POA: Diagnosis not present

## 2019-01-17 DIAGNOSIS — F0391 Unspecified dementia with behavioral disturbance: Secondary | ICD-10-CM | POA: Diagnosis not present

## 2019-02-08 DIAGNOSIS — H6123 Impacted cerumen, bilateral: Secondary | ICD-10-CM | POA: Diagnosis not present

## 2019-03-27 NOTE — Progress Notes (Signed)
PATIENT: [name] [name] DOB: 1932/12/27  REASON FOR VISIT: follow up HISTORY FROM: patient  HISTORY OF PRESENT ILLNESS: Today 03/28/19  HISTORY  [name] [name] is a 85 years old female, accompanied by her son [name], seen in request by her primary care physician Dr. Reesa Chew, Marlene Lard for evaluation of memory loss, hallucinations, initial evaluation was on September 27, 2018.  I have reviewed and summarized the referring note from the referring physician.  She had a past medical history of hypothyroidism, on supplement, hypertension,  She lives with her husband, had a strained relationship for many years, she is a retired Scientist, clinical (histocompatibility and immunogenetics), denies family history of dementia,  Over the past few years, she was noted to have gradual onset memory loss, misplace her card, hearing aids, gradually getting worse, eventually was forced to stop driving in 5170.  She also developed delusional idea, thinking her husband bring woman to their home, was treated by a psychiatrist over the past 1 year, was put on Zyprexa, which helped her some, recent decrease dose from 10 mg to 5 mg every night has decreased side effect of excessive daytime sleepiness,  Now she has very sedentary lifestyle, no longer enjoying cooking, sitting in her chair all day long, worsening confusion delusional idea since her UTI,  I personally reviewed CT head in April 2019 mild generalized atrophy, no acute abnormality.  Laboratory evaluations in April 2018 showed normal BMP, CBC showed hemoglobin of 11.8, normal TSH, B12,  Update March 29, 2019 SS: Last memory score 19/30, remains on Namenda 10 mg twice daily, Zyprexa 5 mg at night. No change in memory. The hallucinations are not as significant. She is sleeping a lot, during the day and night. She lives her with husband. Her son lives in [place], [place]. Her husband manages medications. She is able to do ADLs, dressing, bathing. Her appetite is good. Doesn't drive a car. She watches TV.  Personal hygiene is a problem, sore to her buttocks, has seen primary about it and given cream. No falls.   REVIEW OF SYSTEMS: Out of a complete 14 system review of symptoms, the patient complains only of the following symptoms, and all other reviewed systems are negative.  Memory loss, drowsiness   ALLERGIES: Allergies  Allergen Reactions  . Butalbital-Apap-Caffeine     REACTION: not well  . Citalopram Hydrobromide     REACTION: elev LFTs  . Codeine Sulfate     REACTION: Nausea/vomiting  . Meloxicam     Upset stomach   . Nortriptyline Hcl     REACTION: nightmares  . Prevnar [Pneumococcal 13-Val Conj Vacc] Nausea And Vomiting    HOME MEDICATIONS: Outpatient Medications Prior to Visit  Medication Sig Dispense Refill  . amLODipine (NORVASC) 5 MG tablet TAKE 1 TABLET EVERY DAY 90 tablet 3  . aspirin 81 MG tablet Take 81 mg by mouth daily.      . B Complex Vitamins (B COMPLEX PO) Take 1 tablet by mouth daily.    . Coenzyme Q10 (CO Q 10 PO) Take 1 tablet by mouth daily.    Marland Kitchen levothyroxine (SYNTHROID, LEVOTHROID) 75 MCG tablet TAKE ONE TABLET BY MOUTH ONCE DAILY 30 tablet 5  . memantine (NAMENDA) 10 MG tablet Take 1 tablet (10 mg total) by mouth 2 (two) times daily. (Patient taking differently: Take 5 mg by mouth 2 (two) times daily. ) 180 tablet 4  . MYRBETRIQ 50 MG TB24 tablet Take 50 mg by mouth daily.    Marland Kitchen OLANZapine (ZYPREXA) 5  MG tablet 1  qhs 30 tablet 2  . sertraline (ZOLOFT) 50 MG tablet Take 50 mg by mouth daily.     No facility-administered medications prior to visit.     PAST MEDICAL HISTORY: Past Medical History:  Diagnosis Date  . Adenomatous colon polyp 05/2002  . Anxiety   . Arthritis    psoriatic  . Deviated septum   . Fatty liver disease, nonalcoholic   . Hard of hearing   . Hepatic cyst   . HTN (hypertension)   . Hyperlipidemia   . Hypothyroidism   . Irritable bladder   . Memory loss   . Pelvic fracture ([place]) 9/14   R  . Platelets decreased ([place])  2003   low since 2003, Dr Ralene Ok  . WBC decreased 2003   low since 2003, Dr Ralene Ok    PAST SURGICAL HISTORY: Past Surgical History:  Procedure Laterality Date  . BLADDER REPAIR    . NASAL SEPTUM SURGERY    . VAGINAL HYSTERECTOMY      FAMILY HISTORY: Family History  Problem Relation Age of Onset  . Hypertension Mother   . Heart disease Mother   . Heart disease Father   . Hypertension Father   . Cirrhosis Sister        elevated LFTs    SOCIAL HISTORY: Social History   Socioeconomic History  . Marital status: Married    Spouse name: Not on file  . Number of children: 2  . Years of education: 71  . Highest education level: High school graduate  Occupational History  . Occupation: Retired  Scientific laboratory technician  . Financial resource strain: Not on file  . Food insecurity    Worry: Not on file    Inability: Not on file  . Transportation needs    Medical: Not on file    Non-medical: Not on file  Tobacco Use  . Smoking status: Never Smoker  . Smokeless tobacco: Never Used  Substance and Sexual Activity  . Alcohol use: No    Alcohol/week: 0.0 standard drinks  . Drug use: No  . Sexual activity: Never  Lifestyle  . Physical activity    Days per week: Not on file    Minutes per session: Not on file  . Stress: Not on file  Relationships  . Social Herbalist on phone: Not on file    Gets together: Not on file    Attends religious service: Not on file    Active member of club or organization: Not on file    Attends meetings of clubs or organizations: Not on file    Relationship status: Not on file  . Intimate partner violence    Fear of current or ex partner: Not on file    Emotionally abused: Not on file    Physically abused: Not on file    Forced sexual activity: Not on file  Other Topics Concern  . Not on file  Social History Narrative   Lives at home with husband (full time) and son (part time).   1 cup caffeine per day.   Right-handed.     PHYSICAL EXAM  Vitals:   03/28/19 1443  BP: 130/77  Pulse: 86  Temp: 97.8 F (36.6 C)  TempSrc: Oral  Weight: 174 lb 12.8 oz (79.3 kg)  Height: 5\' 6"  (1.676 m)   Body mass index is 28.21 kg/m.  Generalized: Well developed, in no acute distress  MMSE - Mini Mental State Exam 03/28/2019 09/27/2018  Not completed: (No Data) -  Orientation to time 2 0  Orientation to Place 3 3  Registration 3 3  Attention/ Calculation 0 4  Recall 0 0  Language- name 2 objects 2 2  Language- repeat 1 1  Language- follow 3 step command 3 3  Language- read & follow direction 1 1  Write a sentence 1 1  Copy design 1 1  Total score 17 19    Neurological examination  Mentation: Alert oriented to time, place, history taking. Follows all commands speech and language fluent Cranial nerve II-XII: Pupils were equal round reactive to light. Extraocular movements were full, visual field were full on confrontational test. Facial sensation and strength were normal. Head turning and shoulder shrug were normal and symmetric. Motor: The motor testing reveals 5 over 5 strength of all 4 extremities. Good symmetric motor tone is noted throughout.  Sensory: Sensory testing is intact to soft touch on all 4 extremities. No evidence of extinction is noted.  Coordination: Cerebellar testing reveals good finger-nose-finger and heel-to-shin bilaterally.  Gait and station: Gait is normal.  Reflexes: Deep tendon reflexes are symmetric and normal bilaterally.   DIAGNOSTIC DATA (LABS, IMAGING, TESTING) - I reviewed patient records, labs, notes, testing and imaging myself where available.  Lab Results  Component Value Date   WBC 3.9 (L) 11/20/2016   HGB 11.8 (L) 11/20/2016   HCT 36.4 11/20/2016   MCV 81.4 11/20/2016   PLT 62.0 (L) 11/20/2016      Component Value Date/Time   NA 139 11/20/2016 1145   K 3.9 11/20/2016 1145   CL 106 11/20/2016 1145   CO2 26 11/20/2016 1145   GLUCOSE 92 11/20/2016 1145   BUN 14  11/20/2016 1145   CREATININE 0.87 11/20/2016 1145   CALCIUM 9.6 11/20/2016 1145   PROT 7.5 11/20/2016 1145   ALBUMIN 4.2 11/20/2016 1145   AST 23 11/20/2016 1145   ALT 24 11/20/2016 1145   ALKPHOS 56 11/20/2016 1145   BILITOT 0.5 11/20/2016 1145   GFRNONAA 71.11 05/28/2010 0905   GFRAA 90 12/23/2007 0807   Lab Results  Component Value Date   CHOL 228 (H) 08/16/2014   HDL 38.10 (L) 08/16/2014   LDLCALC 173 (H) 08/16/2014   LDLDIRECT 172.4 12/04/2010   TRIG 84.0 08/16/2014   CHOLHDL 6 08/16/2014   Lab Results  Component Value Date   HGBA1C 5.7 05/28/2010   Lab Results  Component Value Date   VITAMINB12 519 11/20/2016   Lab Results  Component Value Date   TSH 2.47 11/20/2016    ASSESSMENT AND PLAN 85 y.o. year old female  has a past medical history of Adenomatous colon polyp (05/2002), Anxiety, Arthritis, Deviated septum, Fatty liver disease, nonalcoholic, Hard of hearing, Hepatic cyst, HTN (hypertension), Hyperlipidemia, Hypothyroidism, Irritable bladder, Memory loss, Pelvic fracture ([place]) (9/14), Platelets decreased ([place]) (2003), and WBC decreased (2003). here with:  1.  Dementia with behavior change -Memory score is stable -Continue Namenda, she was to increase dose to 10 mg twice a day after office visit 09/27/2018, it seems she stayed at 5 mg twice a day, will check with primary doctor to see if there was a reason -Complains of daytime sleepiness, may consider dose reduction of Zyprexa, hallucinations under better control, will discuss with primary doctor prescribing -I have given the son a dementia memory packet.  They are considering some in-home care, she may benefit from a day program for social interaction -She will follow-up in 6 months or sooner if needed  I spent 15 minutes with the patient. 50% of this time was spent discussing her plan of care.   Butler Denmark, AGNP-C, DNP 03/28/2019, 3:31 PM [facility] [address] [place], [place] [address] [phone]

## 2019-03-28 ENCOUNTER — Encounter: Payer: Self-pay | Admitting: Neurology

## 2019-03-28 ENCOUNTER — Ambulatory Visit (INDEPENDENT_AMBULATORY_CARE_PROVIDER_SITE_OTHER): Payer: Medicare HMO | Admitting: Neurology

## 2019-03-28 ENCOUNTER — Other Ambulatory Visit: Payer: Self-pay

## 2019-03-28 VITALS — BP 130/77 | HR 86 | Temp 97.8°F | Ht 66.0 in | Wt 174.8 lb

## 2019-03-28 DIAGNOSIS — F0391 Unspecified dementia with behavioral disturbance: Secondary | ICD-10-CM | POA: Diagnosis not present

## 2019-03-28 NOTE — Patient Instructions (Addendum)
You may discuss with primary care doctor about decreasing your dose of Zyprexa to see if that helps with sleepiness. You may continue taking Namenda. We have on record you are taking 10 mg twice daily, please check to see if your primary decreased your dose to 5 mg twice daily. 10 mg twice daily is the full dose.

## 2019-03-30 DIAGNOSIS — F0391 Unspecified dementia with behavioral disturbance: Secondary | ICD-10-CM | POA: Diagnosis not present

## 2019-03-30 DIAGNOSIS — I10 Essential (primary) hypertension: Secondary | ICD-10-CM | POA: Diagnosis not present

## 2019-03-30 DIAGNOSIS — B372 Candidiasis of skin and nail: Secondary | ICD-10-CM | POA: Diagnosis not present

## 2019-03-30 NOTE — Progress Notes (Signed)
I have reviewed and agreed above plan. 

## 2019-05-02 DIAGNOSIS — F0391 Unspecified dementia with behavioral disturbance: Secondary | ICD-10-CM | POA: Diagnosis not present

## 2019-05-02 DIAGNOSIS — I10 Essential (primary) hypertension: Secondary | ICD-10-CM | POA: Diagnosis not present

## 2019-05-02 DIAGNOSIS — R3915 Urgency of urination: Secondary | ICD-10-CM | POA: Diagnosis not present

## 2019-05-02 DIAGNOSIS — B372 Candidiasis of skin and nail: Secondary | ICD-10-CM | POA: Diagnosis not present

## 2019-05-02 DIAGNOSIS — Z23 Encounter for immunization: Secondary | ICD-10-CM | POA: Diagnosis not present

## 2019-05-24 DIAGNOSIS — E039 Hypothyroidism, unspecified: Secondary | ICD-10-CM | POA: Diagnosis not present

## 2019-05-24 DIAGNOSIS — G301 Alzheimer's disease with late onset: Secondary | ICD-10-CM | POA: Diagnosis not present

## 2019-05-24 DIAGNOSIS — F333 Major depressive disorder, recurrent, severe with psychotic symptoms: Secondary | ICD-10-CM | POA: Diagnosis not present

## 2019-05-24 DIAGNOSIS — F0281 Dementia in other diseases classified elsewhere with behavioral disturbance: Secondary | ICD-10-CM | POA: Diagnosis not present

## 2019-05-24 DIAGNOSIS — R350 Frequency of micturition: Secondary | ICD-10-CM | POA: Diagnosis not present

## 2019-05-24 DIAGNOSIS — Z0000 Encounter for general adult medical examination without abnormal findings: Secondary | ICD-10-CM | POA: Diagnosis not present

## 2019-05-24 DIAGNOSIS — Z1389 Encounter for screening for other disorder: Secondary | ICD-10-CM | POA: Diagnosis not present

## 2019-05-24 DIAGNOSIS — R21 Rash and other nonspecific skin eruption: Secondary | ICD-10-CM | POA: Diagnosis not present

## 2019-05-24 DIAGNOSIS — Z6826 Body mass index (BMI) 26.0-26.9, adult: Secondary | ICD-10-CM | POA: Diagnosis not present

## 2019-05-24 DIAGNOSIS — I10 Essential (primary) hypertension: Secondary | ICD-10-CM | POA: Diagnosis not present

## 2019-06-03 ENCOUNTER — Other Ambulatory Visit: Payer: Self-pay | Admitting: Internal Medicine

## 2019-06-07 DIAGNOSIS — B351 Tinea unguium: Secondary | ICD-10-CM | POA: Diagnosis not present

## 2019-06-07 DIAGNOSIS — L304 Erythema intertrigo: Secondary | ICD-10-CM | POA: Diagnosis not present

## 2019-07-12 DIAGNOSIS — B356 Tinea cruris: Secondary | ICD-10-CM | POA: Diagnosis not present

## 2019-07-12 DIAGNOSIS — B354 Tinea corporis: Secondary | ICD-10-CM | POA: Diagnosis not present

## 2019-07-12 DIAGNOSIS — B351 Tinea unguium: Secondary | ICD-10-CM | POA: Diagnosis not present

## 2019-08-26 DIAGNOSIS — R3915 Urgency of urination: Secondary | ICD-10-CM | POA: Diagnosis not present

## 2019-08-26 DIAGNOSIS — F0391 Unspecified dementia with behavioral disturbance: Secondary | ICD-10-CM | POA: Diagnosis not present

## 2019-08-26 DIAGNOSIS — R634 Abnormal weight loss: Secondary | ICD-10-CM | POA: Diagnosis not present

## 2019-08-26 DIAGNOSIS — I10 Essential (primary) hypertension: Secondary | ICD-10-CM | POA: Diagnosis not present

## 2019-08-26 DIAGNOSIS — F333 Major depressive disorder, recurrent, severe with psychotic symptoms: Secondary | ICD-10-CM | POA: Diagnosis not present

## 2019-09-28 DIAGNOSIS — R21 Rash and other nonspecific skin eruption: Secondary | ICD-10-CM | POA: Diagnosis not present

## 2019-10-04 ENCOUNTER — Ambulatory Visit: Payer: Medicare HMO | Admitting: Neurology

## 2019-10-24 DIAGNOSIS — I10 Essential (primary) hypertension: Secondary | ICD-10-CM | POA: Diagnosis not present

## 2019-10-24 DIAGNOSIS — R3915 Urgency of urination: Secondary | ICD-10-CM | POA: Diagnosis not present

## 2019-10-24 DIAGNOSIS — R634 Abnormal weight loss: Secondary | ICD-10-CM | POA: Diagnosis not present

## 2019-10-24 DIAGNOSIS — F333 Major depressive disorder, recurrent, severe with psychotic symptoms: Secondary | ICD-10-CM | POA: Diagnosis not present

## 2019-10-24 DIAGNOSIS — F0391 Unspecified dementia with behavioral disturbance: Secondary | ICD-10-CM | POA: Diagnosis not present

## 2019-11-11 ENCOUNTER — Other Ambulatory Visit: Payer: Self-pay | Admitting: Neurology

## 2019-12-15 ENCOUNTER — Ambulatory Visit: Payer: Medicare HMO | Admitting: Neurology

## 2019-12-15 ENCOUNTER — Other Ambulatory Visit: Payer: Self-pay

## 2019-12-15 ENCOUNTER — Encounter: Payer: Self-pay | Admitting: Neurology

## 2019-12-15 VITALS — BP 141/80 | HR 74 | Temp 97.6°F | Ht 66.0 in | Wt 152.0 lb

## 2019-12-15 DIAGNOSIS — F0391 Unspecified dementia with behavioral disturbance: Secondary | ICD-10-CM | POA: Diagnosis not present

## 2019-12-15 MED ORDER — MEMANTINE HCL 10 MG PO TABS: 10.0000 mg | ORAL_TABLET | Freq: Two times a day (BID) | ORAL | 4 refills | Status: AC

## 2019-12-15 MED ORDER — QUETIAPINE FUMARATE 25 MG PO TABS: 25.0000 mg | ORAL_TABLET | Freq: Every day | ORAL | 1 refills | Status: AC

## 2019-12-15 NOTE — Patient Instructions (Addendum)
Memory score was 20/30 today Continue Namenda  Start Seroquel 25 mg at bedtime for agitation and hallucinations  Return in 6 months or sooner if needed  Quetiapine tablets What is this medicine? QUETIAPINE (kwe TYE a peen) is an antipsychotic. It is used to treat schizophrenia and bipolar disorder, also known as manic-depression. This medicine may be used for other purposes; ask your health care provider or pharmacist if you have questions. COMMON BRAND NAME(S): Seroquel What should I tell my health care provider before I take this medicine? They need to know if you have any of these conditions:  blockage in your bowel  cataracts  constipation  dehydration  diabetes  difficulty swallowing  glaucoma  heart disease  history of breast cancer  kidney disease  liver disease  low blood counts, like low white cell, platelet, or red cell counts  low blood pressure or dizziness when standing up  Parkinson's disease  previous heart attack  prostate disease  seizures  stomach or intestine problems  suicidal thoughts, plans or attempt; a previous suicide attempt by you or a family member  thyroid disease  trouble passing urine  an unusual or allergic reaction to quetiapine, other medicines, foods, dyes, or preservatives  pregnant or trying to get pregnant  breast-feeding How should I use this medicine? Take this medicine by mouth. Swallow it with a drink of water. Follow the directions on the prescription label. If it upsets your stomach you can take it with food. Take your medicine at regular intervals. Do not take it more often than directed. Do not stop taking except on the advice of your doctor or health care professional. A special MedGuide will be given to you by the pharmacist with each prescription and refill. Be sure to read this information carefully each time. Talk to your pediatrician regarding the use of this medicine in children. While this drug may be  prescribed for children as young as 10 years for selected conditions, precautions do apply. Patients over age 41 years may have a stronger reaction to this medicine and need smaller doses. Overdosage: If you think you have taken too much of this medicine contact a poison control center or emergency room at once. NOTE: This medicine is only for you. Do not share this medicine with others. What if I miss a dose? If you miss a dose, take it as soon as you can. If it is almost time for your next dose, take only that dose. Do not take double or extra doses. What may interact with this medicine? Do not take this medicine with any of the following medications:  cisapride  dronedarone  fluconazole  metoclopramide  pimozide  posaconazole  thioridazine This medicine may also interact with the following medications:  alcohol  antihistamines for allergy cough and cold  antiviral medicines for HIV or AIDS  atropine  certain medicines for bladder problems like oxybutynin, tolterodine  certain medicines for blood pressure  certain medicines for depression, anxiety, or psychotic disturbances  certain medicines for diabetes  certain medicines for stomach problems like dicyclomine, hyoscyamine  certain medicines for travel sickness like scopolamine  certain medicines for Parkinson's disease  certain medicines for seizures like carbamazepine, phenobarbital, phenytoin  cimetidine  erythromycin  ipratropium  other medicines that prolong the QT interval (cause an abnormal heart rhythm) like dofetilide  rifampin  steroid medicines like prednisone or cortisone This list may not describe all possible interactions. Give your health care provider a list of all the medicines,  herbs, non-prescription drugs, or dietary supplements you use. Also tell them if you smoke, drink alcohol, or use illegal drugs. Some items may interact with your medicine. What should I watch for while using  this medicine? Visit your health care professional for regular checks on your progress. Tell your health care professional if symptoms do not start to get better or if they get worse. Do not stop taking except on your health care professional's advice. You may develop a severe reaction. Your health care professional will tell you how much medicine to take. You may need to have an eye exam before and during use of this medicine. This medicine may increase blood sugar. Ask your health care provider if changes in diet or medicines are needed if you have diabetes. Patients and their families should watch out for new or worsening depression or thoughts of suicide. Also watch out for sudden or severe changes in feelings such as feeling anxious, agitated, panicky, irritable, hostile, aggressive, impulsive, severely restless, overly excited and hyperactive, or not being able to sleep. If this happens, especially at the beginning of antidepressant treatment or after a change in dose, call your health care professional. Dennis Bast may get dizzy or drowsy. Do not drive, use machinery, or do anything that needs mental alertness until you know how this medicine affects you. Do not stand or sit up quickly, especially if you are an older patient. This reduces the risk of dizzy or fainting spells. Alcohol may interfere with the effect of this medicine. Avoid alcoholic drinks. This drug can cause problems with controlling your body temperature. It can lower the response of your body to cold temperatures. If possible, stay indoors during cold weather. If you must go outdoors, wear warm clothes. It can also lower the response of your body to heat. Do not overheat. Do not over-exercise. Stay out of the sun when possible. If you must be in the sun, wear cool clothing. Drink plenty of water. If you have trouble controlling your body temperature, call your health care provider right away. What side effects may I notice from receiving this  medicine? Side effects that you should report to your doctor or health care professional as soon as possible:  allergic reactions like skin rash, itching or hives, swelling of the face, lips, or tongue  breathing problems  changes in vision  confusion  elevated mood, decreased need for sleep, racing thoughts, impulsive behavior  eye pain  fast, irregular heartbeat  fever or chills, sore throat  inability to keep still  males: prolonged or painful erection  problems with balance, talking, walking  redness, blistering, peeling, or loosening of the skin, including inside the mouth  seizures  signs and symptoms of high blood sugar such as being more thirsty or hungry or having to urinate more than normal. You may also feel very tired or have blurry vision  signs and symptoms of hypothyroidism like fatigue; increased sensitivity to cold; weight gain; hoarseness; thinning hair  signs and symptoms of low blood pressure like dizziness; feeling faint or lightheaded; falls; unusually weak or tired  signs and symptoms of neuroleptic malignant syndrome like confusion; fast, irregular heartbeat; high fever; increased sweating; stiff muscles  sudden numbness or weakness of the face, arm, or leg  suicidal thoughts or other mood changes  trouble swallowing  uncontrollable movements of the arms, face, head, mouth, neck, or upper body Side effects that usually do not require medical attention (report to your doctor or health care professional if  they continue or are bothersome):  change in sex drive or performance  constipation  drowsiness  dry mouth  upset stomach  weight gain This list may not describe all possible side effects. Call your doctor for medical advice about side effects. You may report side effects to FDA at 1-800-FDA-1088. Where should I keep my medicine? Keep out of the reach of children. Store at room temperature between 15 and 30 degrees C (59 and 86  degrees F). Throw away any unused medicine after the expiration date. NOTE: This sheet is a summary. It may not cover all possible information. If you have questions about this medicine, talk to your doctor, pharmacist, or health care provider.  2020 Elsevier/Gold Standard (2019-05-25 11:59:11)

## 2019-12-15 NOTE — Progress Notes (Signed)
PATIENT: [name] [name] DOB: [birth year]  REASON FOR VISIT: follow up HISTORY FROM: patient  HISTORY OF PRESENT ILLNESS: Today 12/15/19  HISTORY  HISTORY  [name] [name] a 86 years old female,accompanied by her son [name] [name] in request by her primary care physician Dr.Amin, Saadfor evaluation of memory loss, hallucinations, initial evaluation was on September 27, 2018.  I have reviewed and summarized the referring note from the referring physician.She had a past medical history of hypothyroidism, on supplement, hypertension,  She lives with her husband, had a strained relationship for many years, she is a retired Scientist, clinical (histocompatibility and immunogenetics), denies family history of dementia,  Over the past few years, she was noted to have gradual onset memory loss, misplace her card, hearing aids, gradually getting worse, eventually was forced to stop driving in [id number].  She also developed delusional idea, thinking her husband bring woman to their home, was treated by a psychiatrist over the past 1 year, was put on Zyprexa, which helped her some, recent decrease dose from 10 mg to 5 mg every night has decreased side effect of excessive daytime sleepiness,  Nowshe has very sedentary lifestyle, no longer enjoying cooking, sitting in her chair all day long, worsening confusion delusional idea since her UTI,  I personally reviewed CT head in April 2019 mild generalized atrophy, no acute abnormality.  Laboratory evaluations in April 2018 showed normal BMP, CBC showed hemoglobin of 11.8,normal TSH, B12,  Update March 29, 2019 SS: Last memory score 19/30, remains on Namenda 10 mg twice daily, Zyprexa 5 mg at night. No change in memory. The hallucinations are not as significant. She is sleeping a lot, during the day and night. She lives her with husband. Her son lives in [place], [place]. Her husband manages medications. She is able to do ADLs, dressing, bathing. Her appetite is good. Doesn't drive a car. She  watches TV. Personal hygiene is a problem, sore to her buttocks, has seen primary about it and given cream. No falls.   Update Dec 15, 2019 SS: Here today with her husband. MMSE was 20/30. Lives with her husband, sleeps in another room from her husband. She has been hearing things, hearing music at night, seems to think her husband is bringing other women in the house. She sleeps a lot during the day, roaming around at night in the house. 1 time she left the house, went to a neighbors house. Her husband handles the household affairs, she watches TVs mostly the news.  These auditory hallucinations, have been going on for years, but have been worsening at night.  She is no longer taking Zyprexa, husband isn't sure why, saw Dr. Casimiro Needle Jan 2020, Zyprexa was discontinued.   REVIEW OF SYSTEMS: Out of a complete 14 system review of symptoms, the patient complains only of the following symptoms, and all other reviewed systems are negative.  Memory loss, hallucinations  ALLERGIES: Allergies  Allergen Reactions  . Butalbital-Apap-Caffeine     REACTION: not well  . Citalopram Hydrobromide     REACTION: elev LFTs  . Codeine Sulfate     REACTION: Nausea/vomiting  . Meloxicam     Upset stomach   . Nortriptyline Hcl     REACTION: nightmares  . Prevnar [Pneumococcal 13-Val Conj Vacc] Nausea And Vomiting    HOME MEDICATIONS: Outpatient Medications Prior to Visit  Medication Sig Dispense Refill  . amLODipine (NORVASC) 5 MG tablet TAKE 1 TABLET EVERY DAY 90 tablet 3  . aspirin 81 MG tablet Take 81 mg by  mouth daily.      . B Complex Vitamins (B COMPLEX PO) Take 1 tablet by mouth daily.    . Coenzyme Q10 (CO Q 10 PO) Take 1 tablet by mouth daily.    Marland Kitchen levothyroxine (SYNTHROID, LEVOTHROID) 75 MCG tablet TAKE ONE TABLET BY MOUTH ONCE DAILY 30 tablet 5  . losartan (COZAAR) 50 MG tablet     . memantine (NAMENDA) 10 MG tablet Take 1 tablet (10 mg total) by mouth 2 (two) times daily. (Patient taking  differently: Take 5 mg by mouth 2 (two) times daily. ) 180 tablet 4  . sertraline (ZOLOFT) 50 MG tablet Take 50 mg by mouth daily.    . TOVIAZ 4 MG TB24 tablet     . MYRBETRIQ 50 MG TB24 tablet Take 50 mg by mouth daily.    Marland Kitchen OLANZapine (ZYPREXA) 5 MG tablet 1  qhs (Patient not taking: Reported on 12/15/2019) 30 tablet 2   No facility-administered medications prior to visit.    PAST MEDICAL HISTORY: Past Medical History:  Diagnosis Date  . Adenomatous colon polyp 05/2002  . Anxiety   . Arthritis    psoriatic  . Deviated septum   . Fatty liver disease, nonalcoholic   . Hard of hearing   . Hepatic cyst   . HTN (hypertension)   . Hyperlipidemia   . Hypothyroidism   . Irritable bladder   . Memory loss   . Pelvic fracture ([place]) 9/14   R  . Platelets decreased ([place]) 2003   low since 2003, Dr Ralene Ok  . WBC decreased 2003   low since 2003, Dr Ralene Ok    PAST SURGICAL HISTORY: Past Surgical History:  Procedure Laterality Date  . BLADDER REPAIR    . NASAL SEPTUM SURGERY    . VAGINAL HYSTERECTOMY      FAMILY HISTORY: Family History  Problem Relation Age of Onset  . Hypertension Mother   . Heart disease Mother   . Heart disease Father   . Hypertension Father   . Cirrhosis Sister        elevated LFTs    SOCIAL HISTORY: Social History   Socioeconomic History  . Marital status: Married    Spouse name: Not on file  . Number of children: 2  . Years of education: 40  . Highest education level: High school graduate  Occupational History  . Occupation: Retired  Tobacco Use  . Smoking status: Never Smoker  . Smokeless tobacco: Never Used  Substance and Sexual Activity  . Alcohol use: No    Alcohol/week: 0.0 standard drinks  . Drug use: No  . Sexual activity: Never  Other Topics Concern  . Not on file  Social History Narrative   Lives at home with husband (full time) and son (part time).   1 cup caffeine per day.   Right-handed.   Social Determinants of Health    Financial Resource Strain:   . Difficulty of Paying Living Expenses:   Food Insecurity:   . Worried About Charity fundraiser in the Last Year:   . Arboriculturist in the Last Year:   Transportation Needs:   . Film/video editor (Medical):   Marland Kitchen Lack of Transportation (Non-Medical):   Physical Activity:   . Days of Exercise per Week:   . Minutes of Exercise per Session:   Stress:   . Feeling of Stress :   Social Connections:   . Frequency of Communication with Friends and Family:   .  Frequency of Social Gatherings with Friends and Family:   . Attends Religious Services:   . Active Member of Clubs or Organizations:   . Attends Archivist Meetings:   Marland Kitchen Marital Status:   Intimate Partner Violence:   . Fear of Current or Ex-Partner:   . Emotionally Abused:   Marland Kitchen Physically Abused:   . Sexually Abused:     PHYSICAL EXAM  Vitals:   12/15/19 1256  BP: (!) 141/80  Pulse: 74  Temp: 97.6 F (36.4 C)  Weight: 152 lb (68.9 kg)  Height: 5\' 6"  (1.676 m)   Body mass index is 24.53 kg/m.  Generalized: Well developed, in no acute distress  MMSE - Mini Mental State Exam 12/15/2019 03/28/2019 09/27/2018  Not completed: - (No Data) -  Orientation to time 1 2 0  Orientation to Place 4 3 3   Registration 3 3 3   Attention/ Calculation 3 0 4  Recall 0 0 0  Language- name 2 objects 2 2 2   Language- repeat 1 1 1   Language- follow 3 step command 3 3 3   Language- read & follow direction 1 1 1   Write a sentence 1 1 1   Copy design 1 1 1   Copy design-comments 4 animals - -  Total score 20 17 19     Neurological examination  Mentation: Alert, pleasant, most history is provided by her husband. Follows all commands speech and language fluent Cranial nerve II-XII: Pupils were equal round reactive to light. Extraocular movements were full, visual field were full on confrontational test. Facial sensation and strength were normal. Head turning and shoulder shrug were normal and  symmetric. Motor: The motor testing reveals 5 over 5 strength of all 4 extremities. Good symmetric motor tone is noted throughout.  Sensory: Sensory testing is intact to soft touch on all 4 extremities. No evidence of extinction is noted.  Coordination: Cerebellar testing reveals good finger-nose-finger and heel-to-shin bilaterally.  Gait and station: Slightly wide-based, no assistive device Reflexes: Deep tendon reflexes are symmetric and normal bilaterally.   DIAGNOSTIC DATA (LABS, IMAGING, TESTING) - I reviewed patient records, labs, notes, testing and imaging myself where available.  Lab Results  Component Value Date   WBC 3.9 (L) 11/20/2016   HGB 11.8 (L) 11/20/2016   HCT 36.4 11/20/2016   MCV 81.4 11/20/2016   PLT 62.0 (L) 11/20/2016      Component Value Date/Time   NA 139 11/20/2016 1145   K 3.9 11/20/2016 1145   CL 106 11/20/2016 1145   CO2 26 11/20/2016 1145   GLUCOSE 92 11/20/2016 1145   BUN 14 11/20/2016 1145   CREATININE 0.87 11/20/2016 1145   CALCIUM 9.6 11/20/2016 1145   PROT 7.5 11/20/2016 1145   ALBUMIN 4.2 11/20/2016 1145   AST 23 11/20/2016 1145   ALT 24 11/20/2016 1145   ALKPHOS 56 11/20/2016 1145   BILITOT 0.5 11/20/2016 1145   GFRNONAA 71.11 05/28/2010 0905   GFRAA 90 12/23/2007 0807   Lab Results  Component Value Date   CHOL 228 (H) 08/16/2014   HDL 38.10 (L) 08/16/2014   LDLCALC 173 (H) 08/16/2014   LDLDIRECT 172.4 12/04/2010   TRIG 84.0 08/16/2014   CHOLHDL 6 08/16/2014   Lab Results  Component Value Date   HGBA1C 5.7 05/28/2010   Lab Results  Component Value Date   VITAMINB12 519 11/20/2016   Lab Results  Component Value Date   TSH 2.47 11/20/2016      ASSESSMENT AND PLAN 86 y.o. year old  female  has a past medical history of Adenomatous colon polyp (05/2002), Anxiety, Arthritis, Deviated septum, Fatty liver disease, nonalcoholic, Hard of hearing, Hepatic cyst, HTN (hypertension), Hyperlipidemia, Hypothyroidism, Irritable bladder,  Memory loss, Pelvic fracture ([place]) (9/14), Platelets decreased ([place]) (2003), and WBC decreased (2003). here with:  1.  Dementia with behavior disturbance -Memory score overall stable, 20/30 -Continue Namenda 10 mg twice a day -Is no longer taking Zyprexa, possibly due to improvement/potential cause of drowsiness, but auditory hallucinations are becoming more significant in the evening, it appears in 2018 she was on Seroquel and did well -We will retry Seroquel 25 mg at bedtime, cautioned to look out for drowsiness, auditory hallucinations are not a new issue -Given information about [facility], if it may be an option/good resource going forward, discussed need to plan for the future -Continue routine follow-up with primary doctor, follow-up here in 6 months or sooner if needed  I spent 30 minutes of face-to-face and non-face-to-face time with patient.  This included previsit chart review, lab review, study review, order entry, electronic health record documentation, patient education.  Butler Denmark, AGNP-C, DNP 12/15/2019, 1:15 PM [facility] [address], [place], [place] [address] [phone]

## 2019-12-20 NOTE — Progress Notes (Signed)
I have reviewed and agreed above plan. 

## 2019-12-26 DIAGNOSIS — E039 Hypothyroidism, unspecified: Secondary | ICD-10-CM | POA: Diagnosis not present

## 2019-12-26 DIAGNOSIS — I10 Essential (primary) hypertension: Secondary | ICD-10-CM | POA: Diagnosis not present

## 2019-12-26 DIAGNOSIS — F0391 Unspecified dementia with behavioral disturbance: Secondary | ICD-10-CM | POA: Diagnosis not present

## 2019-12-26 DIAGNOSIS — F333 Major depressive disorder, recurrent, severe with psychotic symptoms: Secondary | ICD-10-CM | POA: Diagnosis not present

## 2019-12-26 DIAGNOSIS — R3915 Urgency of urination: Secondary | ICD-10-CM | POA: Diagnosis not present

## 2019-12-30 ENCOUNTER — Telehealth: Payer: Self-pay | Admitting: Neurology

## 2019-12-30 NOTE — Telephone Encounter (Signed)
Pt's husband Hermiz,Bobby has called to report QUEtiapine (SEROQUEL) 25 MG tablet causing pt to have to use the bathroom frequently.  Husband states it does help pt to go to bed but it concerns him that pt is having to use the bathroom so often.  Please call pt(husband is aware he is not on DPR)

## 2020-01-02 NOTE — Telephone Encounter (Signed)
I am not aware of this being a common side effect with Seroquel. Maybe she can try taking 1/2 tablet. She has been on this before,. Also, wonder if she needs a UA? She is on Toviaz, may be chronic issue?

## 2020-01-02 NOTE — Telephone Encounter (Signed)
Called the patient to discuss the concern in regards to the patient having increase in urination. Pt is having this increase in having to void during the daytime as well as night. Advised that typically the medication is not long lasting and I would be a little concerned that she could potentially have a UTI. Advised that it may be worth taking a urine sample to primary care and having them test if there is a UTI. Advised the husband Judson Roch states he can try cutting the tablet in half and see if that helps. Pt's husband was willing to do that. Encouraged him to reach out to PCP to assess for UTI. Pt's husband verbalized understanding.

## 2020-01-24 DIAGNOSIS — L300 Nummular dermatitis: Secondary | ICD-10-CM | POA: Diagnosis not present

## 2020-01-24 DIAGNOSIS — L439 Lichen planus, unspecified: Secondary | ICD-10-CM | POA: Diagnosis not present

## 2020-01-24 DIAGNOSIS — B351 Tinea unguium: Secondary | ICD-10-CM | POA: Diagnosis not present

## 2020-01-24 DIAGNOSIS — L432 Lichenoid drug reaction: Secondary | ICD-10-CM | POA: Diagnosis not present

## 2020-03-28 DIAGNOSIS — R3915 Urgency of urination: Secondary | ICD-10-CM | POA: Diagnosis not present

## 2020-03-28 DIAGNOSIS — I10 Essential (primary) hypertension: Secondary | ICD-10-CM | POA: Diagnosis not present

## 2020-03-28 DIAGNOSIS — F333 Major depressive disorder, recurrent, severe with psychotic symptoms: Secondary | ICD-10-CM | POA: Diagnosis not present

## 2020-03-28 DIAGNOSIS — E039 Hypothyroidism, unspecified: Secondary | ICD-10-CM | POA: Diagnosis not present

## 2020-03-28 DIAGNOSIS — F0391 Unspecified dementia with behavioral disturbance: Secondary | ICD-10-CM | POA: Diagnosis not present

## 2020-05-07 ENCOUNTER — Other Ambulatory Visit: Payer: Self-pay | Admitting: Neurology

## 2020-06-04 DIAGNOSIS — F333 Major depressive disorder, recurrent, severe with psychotic symptoms: Secondary | ICD-10-CM | POA: Diagnosis not present

## 2020-06-04 DIAGNOSIS — E039 Hypothyroidism, unspecified: Secondary | ICD-10-CM | POA: Diagnosis not present

## 2020-06-04 DIAGNOSIS — I10 Essential (primary) hypertension: Secondary | ICD-10-CM | POA: Diagnosis not present

## 2020-06-04 DIAGNOSIS — R110 Nausea: Secondary | ICD-10-CM | POA: Diagnosis not present

## 2020-06-04 DIAGNOSIS — F0391 Unspecified dementia with behavioral disturbance: Secondary | ICD-10-CM | POA: Diagnosis not present

## 2020-06-19 ENCOUNTER — Ambulatory Visit: Payer: Medicare HMO | Admitting: Neurology

## 2020-06-19 NOTE — Progress Notes (Deleted)
PATIENT: [name] [name] DOB: 1933/06/05  REASON FOR VISIT: follow up HISTORY FROM: patient  HISTORY OF PRESENT ILLNESS: Today 06/19/20  HISTORY [name] Perot Meltonis a 86 years old female,accompanied by her son Dierdre Highman in request by her primary care physician Dr.Amin, Saadfor evaluation of memory loss, hallucinations, initial evaluation was on September 27, 2018.  I have reviewed and summarized the referring note from the referring physician.She had a past medical history of hypothyroidism, on supplement, hypertension,  She lives with her husband, had a strained relationship for many years, she is a retired Scientist, clinical (histocompatibility and immunogenetics), denies family history of dementia,  Over the past few years, she was noted to have gradual onset memory loss, misplace her card, hearing aids, gradually getting worse, eventually was forced to stop driving in 0981.  She also developed delusional idea, thinking her husband bring woman to their home, was treated by a psychiatrist over the past 1 year, was put on Zyprexa, which helped her some, recent decrease dose from 10 mg to 5 mg every night has decreased side effect of excessive daytime sleepiness,  Nowshe has very sedentary lifestyle, no longer enjoying cooking, sitting in her chair all day long, worsening confusion delusional idea since her UTI,  I personally reviewed CT head in April 2019 mild generalized atrophy, no acute abnormality.  Laboratory evaluations in April 2018 showed normal BMP, CBC showed hemoglobin of 11.8,normal TSH, B12,  Update March 29, 2019 XB:JYNW memory score 19/30, remains on Namenda 10 mg twice daily, Zyprexa 5 mg at night. No change in memory. The hallucinations are not as significant. She is sleeping a lot, during the day and night. She lives her with husband. Her son lives in [place], [place]. Her husband manages medications. She is able to do ADLs, dressing, bathing. Her appetite is good. Doesn't drive a car. She watches TV.  Personal hygiene is a problem, sore to her buttocks, has seen primary about it and given cream. No falls.  Update Dec 15, 2019 SS: Here today with her husband. MMSE was 20/30. Lives with her husband, sleeps in another room from her husband. She has been hearing things, hearing music at night, seems to think her husband is bringing other women in the house. She sleeps a lot during the day, roaming around at night in the house. 1 time she left the house, went to a neighbors house. Her husband handles the household affairs, she watches TVs mostly the news.  These auditory hallucinations, have been going on for years, but have been worsening at night.  She is no longer taking Zyprexa, husband isn't sure why, saw Dr. Casimiro Needle Jan 2020, Zyprexa was discontinued.    Update June 19, 2020 SS:   REVIEW OF SYSTEMS: Out of a complete 14 system review of symptoms, the patient complains only of the following symptoms, and all other reviewed systems are negative.  ALLERGIES: Allergies  Allergen Reactions  . Butalbital-Apap-Caffeine     REACTION: not well  . Citalopram Hydrobromide     REACTION: elev LFTs  . Codeine Sulfate     REACTION: Nausea/vomiting  . Meloxicam     Upset stomach   . Nortriptyline Hcl     REACTION: nightmares  . Prevnar [Pneumococcal 13-Val Conj Vacc] Nausea And Vomiting    HOME MEDICATIONS: Outpatient Medications Prior to Visit  Medication Sig Dispense Refill  . amLODipine (NORVASC) 5 MG tablet TAKE 1 TABLET EVERY DAY 90 tablet 3  . aspirin 81 MG tablet Take 81 mg by mouth  daily.      . B Complex Vitamins (B COMPLEX PO) Take 1 tablet by mouth daily.    . Coenzyme Q10 (CO Q 10 PO) Take 1 tablet by mouth daily.    Marland Kitchen levothyroxine (SYNTHROID, LEVOTHROID) 75 MCG tablet TAKE ONE TABLET BY MOUTH ONCE DAILY 30 tablet 5  . losartan (COZAAR) 50 MG tablet     . memantine (NAMENDA) 10 MG tablet Take 1 tablet (10 mg total) by mouth 2 (two) times daily. 180 tablet 4  . QUEtiapine  (SEROQUEL) 25 MG tablet Take 1 tablet (25 mg total) by mouth at bedtime. 90 tablet 1  . sertraline (ZOLOFT) 50 MG tablet Take 50 mg by mouth daily.    . TOVIAZ 4 MG TB24 tablet      No facility-administered medications prior to visit.    PAST MEDICAL HISTORY: Past Medical History:  Diagnosis Date  . Adenomatous colon polyp 05/2002  . Anxiety   . Arthritis    psoriatic  . Deviated septum   . Fatty liver disease, nonalcoholic   . Hard of hearing   . Hepatic cyst   . HTN (hypertension)   . Hyperlipidemia   . Hypothyroidism   . Irritable bladder   . Memory loss   . Pelvic fracture ([place]) 9/14   R  . Platelets decreased ([place]) 2003   low since 2003, Dr Ralene Ok  . WBC decreased 2003   low since 2003, Dr Ralene Ok    PAST SURGICAL HISTORY: Past Surgical History:  Procedure Laterality Date  . BLADDER REPAIR    . NASAL SEPTUM SURGERY    . VAGINAL HYSTERECTOMY      FAMILY HISTORY: Family History  Problem Relation Age of Onset  . Hypertension Mother   . Heart disease Mother   . Heart disease Father   . Hypertension Father   . Cirrhosis Sister        elevated LFTs    SOCIAL HISTORY: Social History   Socioeconomic History  . Marital status: Married    Spouse name: Not on file  . Number of children: 2  . Years of education: 21  . Highest education level: High school graduate  Occupational History  . Occupation: Retired  Tobacco Use  . Smoking status: Never Smoker  . Smokeless tobacco: Never Used  Substance and Sexual Activity  . Alcohol use: No    Alcohol/week: 0.0 standard drinks  . Drug use: No  . Sexual activity: Never  Other Topics Concern  . Not on file  Social History Narrative   Lives at home with husband (full time) and son (part time).   1 cup caffeine per day.   Right-handed.   Social Determinants of Health   Financial Resource Strain:   . Difficulty of Paying Living Expenses: Not on file  Food Insecurity:   . Worried About Sales executive in the Last Year: Not on file  . Ran Out of Food in the Last Year: Not on file  Transportation Needs:   . Lack of Transportation (Medical): Not on file  . Lack of Transportation (Non-Medical): Not on file  Physical Activity:   . Days of Exercise per Week: Not on file  . Minutes of Exercise per Session: Not on file  Stress:   . Feeling of Stress : Not on file  Social Connections:   . Frequency of Communication with Friends and Family: Not on file  . Frequency of Social Gatherings with Friends and Family: Not on  file  . Attends Religious Services: Not on file  . Active Member of Clubs or Organizations: Not on file  . Attends Archivist Meetings: Not on file  . Marital Status: Not on file  Intimate Partner Violence:   . Fear of Current or Ex-Partner: Not on file  . Emotionally Abused: Not on file  . Physically Abused: Not on file  . Sexually Abused: Not on file      PHYSICAL EXAM  There were no vitals filed for this visit. There is no height or weight on file to calculate BMI.  Generalized: Well developed, in no acute distress   Neurological examination  Mentation: Alert oriented to time, place, history taking. Follows all commands speech and language fluent Cranial nerve II-XII: Pupils were equal round reactive to light. Extraocular movements were full, visual field were full on confrontational test. Facial sensation and strength were normal. Uvula tongue midline. Head turning and shoulder shrug  were normal and symmetric. Motor: The motor testing reveals 5 over 5 strength of all 4 extremities. Good symmetric motor tone is noted throughout.  Sensory: Sensory testing is intact to soft touch on all 4 extremities. No evidence of extinction is noted.  Coordination: Cerebellar testing reveals good finger-nose-finger and heel-to-shin bilaterally.  Gait and station: Gait is normal. Tandem gait is normal. Romberg is negative. No drift is seen.  Reflexes: Deep tendon  reflexes are symmetric and normal bilaterally.   DIAGNOSTIC DATA (LABS, IMAGING, TESTING) - I reviewed patient records, labs, notes, testing and imaging myself where available.  Lab Results  Component Value Date   WBC 3.9 (L) 11/20/2016   HGB 11.8 (L) 11/20/2016   HCT 36.4 11/20/2016   MCV 81.4 11/20/2016   PLT 62.0 (L) 11/20/2016      Component Value Date/Time   NA 139 11/20/2016 1145   K 3.9 11/20/2016 1145   CL 106 11/20/2016 1145   CO2 26 11/20/2016 1145   GLUCOSE 92 11/20/2016 1145   BUN 14 11/20/2016 1145   CREATININE 0.87 11/20/2016 1145   CALCIUM 9.6 11/20/2016 1145   PROT 7.5 11/20/2016 1145   ALBUMIN 4.2 11/20/2016 1145   AST 23 11/20/2016 1145   ALT 24 11/20/2016 1145   ALKPHOS 56 11/20/2016 1145   BILITOT 0.5 11/20/2016 1145   GFRNONAA 71.11 05/28/2010 0905   GFRAA 90 12/23/2007 0807   Lab Results  Component Value Date   CHOL 228 (H) 08/16/2014   HDL 38.10 (L) 08/16/2014   LDLCALC 173 (H) 08/16/2014   LDLDIRECT 172.4 12/04/2010   TRIG 84.0 08/16/2014   CHOLHDL 6 08/16/2014   Lab Results  Component Value Date   HGBA1C 5.7 05/28/2010   Lab Results  Component Value Date   VITAMINB12 519 11/20/2016   Lab Results  Component Value Date   TSH 2.47 11/20/2016      ASSESSMENT AND PLAN 86 y.o. year old female  has a past medical history of Adenomatous colon polyp (05/2002), Anxiety, Arthritis, Deviated septum, Fatty liver disease, nonalcoholic, Hard of hearing, Hepatic cyst, HTN (hypertension), Hyperlipidemia, Hypothyroidism, Irritable bladder, Memory loss, Pelvic fracture ([place]) (9/14), Platelets decreased ([place]) (2003), and WBC decreased (2003). here with:  1.  Dementia with behavioral disturbance   I spent 15 minutes with the patient. 50% of this time was spent   Butler Denmark, [place], DNP 06/19/2020, 5:52 AM [facility] [address], [place], [place] [address] [phone]

## 2020-07-02 DIAGNOSIS — E039 Hypothyroidism, unspecified: Secondary | ICD-10-CM | POA: Diagnosis not present

## 2020-07-02 DIAGNOSIS — R3915 Urgency of urination: Secondary | ICD-10-CM | POA: Diagnosis not present

## 2020-07-02 DIAGNOSIS — Z23 Encounter for immunization: Secondary | ICD-10-CM | POA: Diagnosis not present

## 2020-07-02 DIAGNOSIS — G3183 Dementia with Lewy bodies: Secondary | ICD-10-CM | POA: Diagnosis not present

## 2020-07-02 DIAGNOSIS — I10 Essential (primary) hypertension: Secondary | ICD-10-CM | POA: Diagnosis not present

## 2020-07-14 DIAGNOSIS — R220 Localized swelling, mass and lump, head: Secondary | ICD-10-CM | POA: Diagnosis not present

## 2020-07-14 DIAGNOSIS — T7840XA Allergy, unspecified, initial encounter: Secondary | ICD-10-CM | POA: Diagnosis not present

## 2020-07-26 DIAGNOSIS — L439 Lichen planus, unspecified: Secondary | ICD-10-CM | POA: Diagnosis not present

## 2020-07-26 DIAGNOSIS — L300 Nummular dermatitis: Secondary | ICD-10-CM | POA: Diagnosis not present

## 2020-09-10 DIAGNOSIS — E039 Hypothyroidism, unspecified: Secondary | ICD-10-CM | POA: Diagnosis not present

## 2020-09-10 DIAGNOSIS — I10 Essential (primary) hypertension: Secondary | ICD-10-CM | POA: Diagnosis not present

## 2020-09-10 DIAGNOSIS — F0281 Dementia in other diseases classified elsewhere with behavioral disturbance: Secondary | ICD-10-CM | POA: Diagnosis not present

## 2020-09-10 DIAGNOSIS — R3915 Urgency of urination: Secondary | ICD-10-CM | POA: Diagnosis not present

## 2020-09-10 DIAGNOSIS — G3183 Dementia with Lewy bodies: Secondary | ICD-10-CM | POA: Diagnosis not present

## 2020-11-06 DIAGNOSIS — Z111 Encounter for screening for respiratory tuberculosis: Secondary | ICD-10-CM | POA: Diagnosis not present

## 2020-11-27 DIAGNOSIS — I2510 Atherosclerotic heart disease of native coronary artery without angina pectoris: Secondary | ICD-10-CM | POA: Diagnosis not present

## 2020-11-27 DIAGNOSIS — F331 Major depressive disorder, recurrent, moderate: Secondary | ICD-10-CM | POA: Diagnosis not present

## 2020-11-27 DIAGNOSIS — I10 Essential (primary) hypertension: Secondary | ICD-10-CM | POA: Diagnosis not present

## 2020-11-27 DIAGNOSIS — E039 Hypothyroidism, unspecified: Secondary | ICD-10-CM | POA: Diagnosis not present

## 2020-11-27 DIAGNOSIS — F0281 Dementia in other diseases classified elsewhere with behavioral disturbance: Secondary | ICD-10-CM | POA: Diagnosis not present

## 2020-11-27 DIAGNOSIS — K219 Gastro-esophageal reflux disease without esophagitis: Secondary | ICD-10-CM | POA: Diagnosis not present

## 2020-11-28 DIAGNOSIS — E559 Vitamin D deficiency, unspecified: Secondary | ICD-10-CM | POA: Diagnosis not present

## 2020-11-28 DIAGNOSIS — E7849 Other hyperlipidemia: Secondary | ICD-10-CM | POA: Diagnosis not present

## 2020-11-28 DIAGNOSIS — E119 Type 2 diabetes mellitus without complications: Secondary | ICD-10-CM | POA: Diagnosis not present

## 2020-11-28 DIAGNOSIS — D518 Other vitamin B12 deficiency anemias: Secondary | ICD-10-CM | POA: Diagnosis not present

## 2020-11-28 DIAGNOSIS — Z79899 Other long term (current) drug therapy: Secondary | ICD-10-CM | POA: Diagnosis not present

## 2020-11-28 DIAGNOSIS — E038 Other specified hypothyroidism: Secondary | ICD-10-CM | POA: Diagnosis not present

## 2020-12-24 DIAGNOSIS — I672 Cerebral atherosclerosis: Secondary | ICD-10-CM | POA: Diagnosis not present

## 2020-12-24 DIAGNOSIS — I700 Atherosclerosis of aorta: Secondary | ICD-10-CM | POA: Diagnosis not present

## 2020-12-24 DIAGNOSIS — M189 Osteoarthritis of first carpometacarpal joint, unspecified: Secondary | ICD-10-CM | POA: Diagnosis not present

## 2020-12-24 DIAGNOSIS — I10 Essential (primary) hypertension: Secondary | ICD-10-CM | POA: Diagnosis not present

## 2020-12-24 DIAGNOSIS — S60222A Contusion of left hand, initial encounter: Secondary | ICD-10-CM | POA: Diagnosis not present

## 2020-12-24 DIAGNOSIS — R102 Pelvic and perineal pain: Secondary | ICD-10-CM | POA: Diagnosis not present

## 2020-12-24 DIAGNOSIS — R079 Chest pain, unspecified: Secondary | ICD-10-CM | POA: Diagnosis not present

## 2020-12-24 DIAGNOSIS — G319 Degenerative disease of nervous system, unspecified: Secondary | ICD-10-CM | POA: Diagnosis not present

## 2020-12-24 DIAGNOSIS — J8410 Pulmonary fibrosis, unspecified: Secondary | ICD-10-CM | POA: Diagnosis not present

## 2020-12-24 DIAGNOSIS — I739 Peripheral vascular disease, unspecified: Secondary | ICD-10-CM | POA: Diagnosis not present

## 2020-12-24 DIAGNOSIS — W19XXXA Unspecified fall, initial encounter: Secondary | ICD-10-CM | POA: Diagnosis not present

## 2020-12-24 DIAGNOSIS — S0003XA Contusion of scalp, initial encounter: Secondary | ICD-10-CM | POA: Diagnosis not present

## 2020-12-24 DIAGNOSIS — M25559 Pain in unspecified hip: Secondary | ICD-10-CM | POA: Diagnosis not present

## 2020-12-25 DIAGNOSIS — I2510 Atherosclerotic heart disease of native coronary artery without angina pectoris: Secondary | ICD-10-CM | POA: Diagnosis not present

## 2020-12-25 DIAGNOSIS — F0281 Dementia in other diseases classified elsewhere with behavioral disturbance: Secondary | ICD-10-CM | POA: Diagnosis not present

## 2020-12-25 DIAGNOSIS — K219 Gastro-esophageal reflux disease without esophagitis: Secondary | ICD-10-CM | POA: Diagnosis not present

## 2020-12-25 DIAGNOSIS — D696 Thrombocytopenia, unspecified: Secondary | ICD-10-CM | POA: Diagnosis not present

## 2020-12-25 DIAGNOSIS — I10 Essential (primary) hypertension: Secondary | ICD-10-CM | POA: Diagnosis not present

## 2020-12-25 DIAGNOSIS — R5381 Other malaise: Secondary | ICD-10-CM | POA: Diagnosis not present

## 2020-12-25 DIAGNOSIS — G3183 Dementia with Lewy bodies: Secondary | ICD-10-CM | POA: Diagnosis not present

## 2021-01-03 DIAGNOSIS — R279 Unspecified lack of coordination: Secondary | ICD-10-CM | POA: Diagnosis not present

## 2021-01-03 DIAGNOSIS — S50311A Abrasion of right elbow, initial encounter: Secondary | ICD-10-CM | POA: Diagnosis not present

## 2021-01-03 DIAGNOSIS — S62101A Fracture of unspecified carpal bone, right wrist, initial encounter for closed fracture: Secondary | ICD-10-CM | POA: Diagnosis not present

## 2021-01-03 DIAGNOSIS — Z743 Need for continuous supervision: Secondary | ICD-10-CM | POA: Diagnosis not present

## 2021-01-03 DIAGNOSIS — R0902 Hypoxemia: Secondary | ICD-10-CM | POA: Diagnosis not present

## 2021-01-03 DIAGNOSIS — S80212A Abrasion, left knee, initial encounter: Secondary | ICD-10-CM | POA: Diagnosis not present

## 2021-01-03 DIAGNOSIS — F0390 Unspecified dementia without behavioral disturbance: Secondary | ICD-10-CM | POA: Diagnosis not present

## 2021-01-03 DIAGNOSIS — S32511A Fracture of superior rim of right pubis, initial encounter for closed fracture: Secondary | ICD-10-CM | POA: Diagnosis not present

## 2021-01-03 DIAGNOSIS — R296 Repeated falls: Secondary | ICD-10-CM | POA: Diagnosis not present

## 2021-01-03 DIAGNOSIS — S80211A Abrasion, right knee, initial encounter: Secondary | ICD-10-CM | POA: Diagnosis not present

## 2021-01-03 DIAGNOSIS — M7989 Other specified soft tissue disorders: Secondary | ICD-10-CM | POA: Diagnosis not present

## 2021-01-03 DIAGNOSIS — S52571A Other intraarticular fracture of lower end of right radius, initial encounter for closed fracture: Secondary | ICD-10-CM | POA: Diagnosis not present

## 2021-01-03 DIAGNOSIS — S62001A Unspecified fracture of navicular [scaphoid] bone of right wrist, initial encounter for closed fracture: Secondary | ICD-10-CM | POA: Diagnosis not present

## 2021-01-03 DIAGNOSIS — M533 Sacrococcygeal disorders, not elsewhere classified: Secondary | ICD-10-CM | POA: Diagnosis not present

## 2021-01-03 DIAGNOSIS — W19XXXA Unspecified fall, initial encounter: Secondary | ICD-10-CM | POA: Diagnosis not present

## 2021-01-04 DIAGNOSIS — J9811 Atelectasis: Secondary | ICD-10-CM | POA: Diagnosis not present

## 2021-01-04 DIAGNOSIS — I959 Hypotension, unspecified: Secondary | ICD-10-CM | POA: Diagnosis not present

## 2021-01-04 DIAGNOSIS — S62101A Fracture of unspecified carpal bone, right wrist, initial encounter for closed fracture: Secondary | ICD-10-CM | POA: Diagnosis not present

## 2021-01-04 DIAGNOSIS — F0281 Dementia in other diseases classified elsewhere with behavioral disturbance: Secondary | ICD-10-CM | POA: Diagnosis not present

## 2021-01-04 DIAGNOSIS — A419 Sepsis, unspecified organism: Secondary | ICD-10-CM | POA: Diagnosis not present

## 2021-01-04 DIAGNOSIS — F331 Major depressive disorder, recurrent, moderate: Secondary | ICD-10-CM | POA: Diagnosis not present

## 2021-01-04 DIAGNOSIS — R0689 Other abnormalities of breathing: Secondary | ICD-10-CM | POA: Diagnosis not present

## 2021-01-04 DIAGNOSIS — K219 Gastro-esophageal reflux disease without esophagitis: Secondary | ICD-10-CM | POA: Diagnosis not present

## 2021-01-04 DIAGNOSIS — W19XXXA Unspecified fall, initial encounter: Secondary | ICD-10-CM | POA: Diagnosis not present

## 2021-01-04 DIAGNOSIS — I10 Essential (primary) hypertension: Secondary | ICD-10-CM | POA: Diagnosis not present

## 2021-01-04 DIAGNOSIS — R0989 Other specified symptoms and signs involving the circulatory and respiratory systems: Secondary | ICD-10-CM | POA: Diagnosis not present

## 2021-01-04 DIAGNOSIS — J690 Pneumonitis due to inhalation of food and vomit: Secondary | ICD-10-CM | POA: Diagnosis not present

## 2021-01-04 DIAGNOSIS — D518 Other vitamin B12 deficiency anemias: Secondary | ICD-10-CM | POA: Diagnosis not present

## 2021-01-04 DIAGNOSIS — J984 Other disorders of lung: Secondary | ICD-10-CM | POA: Diagnosis not present

## 2021-01-04 DIAGNOSIS — R109 Unspecified abdominal pain: Secondary | ICD-10-CM | POA: Diagnosis not present

## 2021-01-04 DIAGNOSIS — E559 Vitamin D deficiency, unspecified: Secondary | ICD-10-CM | POA: Diagnosis not present

## 2021-01-04 DIAGNOSIS — E039 Hypothyroidism, unspecified: Secondary | ICD-10-CM | POA: Diagnosis not present

## 2021-01-04 DIAGNOSIS — R0902 Hypoxemia: Secondary | ICD-10-CM | POA: Diagnosis not present

## 2021-01-04 DIAGNOSIS — R0602 Shortness of breath: Secondary | ICD-10-CM | POA: Diagnosis not present

## 2021-01-04 DIAGNOSIS — F0390 Unspecified dementia without behavioral disturbance: Secondary | ICD-10-CM | POA: Diagnosis not present

## 2021-01-04 DIAGNOSIS — E119 Type 2 diabetes mellitus without complications: Secondary | ICD-10-CM | POA: Diagnosis not present

## 2021-01-04 DIAGNOSIS — I2510 Atherosclerotic heart disease of native coronary artery without angina pectoris: Secondary | ICD-10-CM | POA: Diagnosis not present

## 2021-01-04 DIAGNOSIS — E876 Hypokalemia: Secondary | ICD-10-CM | POA: Diagnosis not present

## 2021-01-04 DIAGNOSIS — I313 Pericardial effusion (noninflammatory): Secondary | ICD-10-CM | POA: Diagnosis not present

## 2021-01-04 DIAGNOSIS — T17920A Food in respiratory tract, part unspecified causing asphyxiation, initial encounter: Secondary | ICD-10-CM | POA: Diagnosis not present

## 2021-01-04 DIAGNOSIS — J90 Pleural effusion, not elsewhere classified: Secondary | ICD-10-CM | POA: Diagnosis not present

## 2021-01-04 DIAGNOSIS — J9690 Respiratory failure, unspecified, unspecified whether with hypoxia or hypercapnia: Secondary | ICD-10-CM | POA: Diagnosis not present

## 2021-01-04 DIAGNOSIS — R000 Tachycardia, unspecified: Secondary | ICD-10-CM | POA: Diagnosis not present

## 2021-01-08 DIAGNOSIS — I2510 Atherosclerotic heart disease of native coronary artery without angina pectoris: Secondary | ICD-10-CM | POA: Diagnosis not present

## 2021-01-08 DIAGNOSIS — I10 Essential (primary) hypertension: Secondary | ICD-10-CM | POA: Diagnosis not present

## 2021-01-08 DIAGNOSIS — D518 Other vitamin B12 deficiency anemias: Secondary | ICD-10-CM | POA: Diagnosis not present

## 2021-01-08 DIAGNOSIS — E559 Vitamin D deficiency, unspecified: Secondary | ICD-10-CM | POA: Diagnosis not present

## 2021-01-08 DIAGNOSIS — F331 Major depressive disorder, recurrent, moderate: Secondary | ICD-10-CM | POA: Diagnosis not present

## 2021-01-08 DIAGNOSIS — E039 Hypothyroidism, unspecified: Secondary | ICD-10-CM | POA: Diagnosis not present

## 2021-01-08 DIAGNOSIS — E119 Type 2 diabetes mellitus without complications: Secondary | ICD-10-CM | POA: Diagnosis not present

## 2021-01-08 DIAGNOSIS — F0281 Dementia in other diseases classified elsewhere with behavioral disturbance: Secondary | ICD-10-CM | POA: Diagnosis not present

## 2021-01-13 DIAGNOSIS — J690 Pneumonitis due to inhalation of food and vomit: Secondary | ICD-10-CM | POA: Diagnosis not present

## 2021-01-13 DIAGNOSIS — F32A Depression, unspecified: Secondary | ICD-10-CM | POA: Diagnosis not present

## 2021-01-13 DIAGNOSIS — E039 Hypothyroidism, unspecified: Secondary | ICD-10-CM | POA: Diagnosis not present

## 2021-01-13 DIAGNOSIS — E876 Hypokalemia: Secondary | ICD-10-CM | POA: Diagnosis not present

## 2021-01-13 DIAGNOSIS — I10 Essential (primary) hypertension: Secondary | ICD-10-CM | POA: Diagnosis not present

## 2021-01-13 DIAGNOSIS — F0280 Dementia in other diseases classified elsewhere without behavioral disturbance: Secondary | ICD-10-CM | POA: Diagnosis not present

## 2021-01-13 DIAGNOSIS — K219 Gastro-esophageal reflux disease without esophagitis: Secondary | ICD-10-CM | POA: Diagnosis not present

## 2021-01-13 DIAGNOSIS — A419 Sepsis, unspecified organism: Secondary | ICD-10-CM | POA: Diagnosis not present

## 2021-01-13 DIAGNOSIS — S61101D Unspecified open wound of right thumb with damage to nail, subsequent encounter: Secondary | ICD-10-CM | POA: Diagnosis not present

## 2021-01-15 DIAGNOSIS — F0280 Dementia in other diseases classified elsewhere without behavioral disturbance: Secondary | ICD-10-CM | POA: Diagnosis not present

## 2021-01-15 DIAGNOSIS — J690 Pneumonitis due to inhalation of food and vomit: Secondary | ICD-10-CM | POA: Diagnosis not present

## 2021-01-15 DIAGNOSIS — I10 Essential (primary) hypertension: Secondary | ICD-10-CM | POA: Diagnosis not present

## 2021-01-15 DIAGNOSIS — S61101D Unspecified open wound of right thumb with damage to nail, subsequent encounter: Secondary | ICD-10-CM | POA: Diagnosis not present

## 2021-01-15 DIAGNOSIS — F32A Depression, unspecified: Secondary | ICD-10-CM | POA: Diagnosis not present

## 2021-01-15 DIAGNOSIS — D696 Thrombocytopenia, unspecified: Secondary | ICD-10-CM | POA: Diagnosis not present

## 2021-01-15 DIAGNOSIS — K219 Gastro-esophageal reflux disease without esophagitis: Secondary | ICD-10-CM | POA: Diagnosis not present

## 2021-01-15 DIAGNOSIS — A419 Sepsis, unspecified organism: Secondary | ICD-10-CM | POA: Diagnosis not present

## 2021-01-15 DIAGNOSIS — F0281 Dementia in other diseases classified elsewhere with behavioral disturbance: Secondary | ICD-10-CM | POA: Diagnosis not present

## 2021-01-15 DIAGNOSIS — E039 Hypothyroidism, unspecified: Secondary | ICD-10-CM | POA: Diagnosis not present

## 2021-01-15 DIAGNOSIS — J189 Pneumonia, unspecified organism: Secondary | ICD-10-CM | POA: Diagnosis not present

## 2021-01-15 DIAGNOSIS — E876 Hypokalemia: Secondary | ICD-10-CM | POA: Diagnosis not present

## 2021-01-16 DIAGNOSIS — A419 Sepsis, unspecified organism: Secondary | ICD-10-CM | POA: Diagnosis not present

## 2021-01-16 DIAGNOSIS — S61101D Unspecified open wound of right thumb with damage to nail, subsequent encounter: Secondary | ICD-10-CM | POA: Diagnosis not present

## 2021-01-16 DIAGNOSIS — K219 Gastro-esophageal reflux disease without esophagitis: Secondary | ICD-10-CM | POA: Diagnosis not present

## 2021-01-16 DIAGNOSIS — E039 Hypothyroidism, unspecified: Secondary | ICD-10-CM | POA: Diagnosis not present

## 2021-01-16 DIAGNOSIS — F0280 Dementia in other diseases classified elsewhere without behavioral disturbance: Secondary | ICD-10-CM | POA: Diagnosis not present

## 2021-01-16 DIAGNOSIS — F32A Depression, unspecified: Secondary | ICD-10-CM | POA: Diagnosis not present

## 2021-01-16 DIAGNOSIS — E876 Hypokalemia: Secondary | ICD-10-CM | POA: Diagnosis not present

## 2021-01-16 DIAGNOSIS — J690 Pneumonitis due to inhalation of food and vomit: Secondary | ICD-10-CM | POA: Diagnosis not present

## 2021-01-16 DIAGNOSIS — I10 Essential (primary) hypertension: Secondary | ICD-10-CM | POA: Diagnosis not present

## 2021-01-17 DIAGNOSIS — I10 Essential (primary) hypertension: Secondary | ICD-10-CM | POA: Diagnosis not present

## 2021-01-17 DIAGNOSIS — A419 Sepsis, unspecified organism: Secondary | ICD-10-CM | POA: Diagnosis not present

## 2021-01-17 DIAGNOSIS — S61101D Unspecified open wound of right thumb with damage to nail, subsequent encounter: Secondary | ICD-10-CM | POA: Diagnosis not present

## 2021-01-17 DIAGNOSIS — E876 Hypokalemia: Secondary | ICD-10-CM | POA: Diagnosis not present

## 2021-01-17 DIAGNOSIS — F0280 Dementia in other diseases classified elsewhere without behavioral disturbance: Secondary | ICD-10-CM | POA: Diagnosis not present

## 2021-01-17 DIAGNOSIS — K219 Gastro-esophageal reflux disease without esophagitis: Secondary | ICD-10-CM | POA: Diagnosis not present

## 2021-01-17 DIAGNOSIS — J690 Pneumonitis due to inhalation of food and vomit: Secondary | ICD-10-CM | POA: Diagnosis not present

## 2021-01-17 DIAGNOSIS — E039 Hypothyroidism, unspecified: Secondary | ICD-10-CM | POA: Diagnosis not present

## 2021-01-17 DIAGNOSIS — F32A Depression, unspecified: Secondary | ICD-10-CM | POA: Diagnosis not present

## 2021-01-21 DIAGNOSIS — M1990 Unspecified osteoarthritis, unspecified site: Secondary | ICD-10-CM | POA: Diagnosis not present

## 2021-01-21 DIAGNOSIS — Z79899 Other long term (current) drug therapy: Secondary | ICD-10-CM | POA: Diagnosis not present

## 2021-01-21 DIAGNOSIS — R296 Repeated falls: Secondary | ICD-10-CM | POA: Diagnosis not present

## 2021-01-21 DIAGNOSIS — I440 Atrioventricular block, first degree: Secondary | ICD-10-CM | POA: Diagnosis not present

## 2021-01-21 DIAGNOSIS — S0990XA Unspecified injury of head, initial encounter: Secondary | ICD-10-CM | POA: Diagnosis not present

## 2021-01-21 DIAGNOSIS — J849 Interstitial pulmonary disease, unspecified: Secondary | ICD-10-CM | POA: Diagnosis not present

## 2021-01-21 DIAGNOSIS — M47812 Spondylosis without myelopathy or radiculopathy, cervical region: Secondary | ICD-10-CM | POA: Diagnosis not present

## 2021-01-21 DIAGNOSIS — G3183 Dementia with Lewy bodies: Secondary | ICD-10-CM | POA: Diagnosis not present

## 2021-01-21 DIAGNOSIS — F0280 Dementia in other diseases classified elsewhere without behavioral disturbance: Secondary | ICD-10-CM | POA: Diagnosis not present

## 2021-01-21 DIAGNOSIS — R0902 Hypoxemia: Secondary | ICD-10-CM | POA: Diagnosis not present

## 2021-01-21 DIAGNOSIS — N281 Cyst of kidney, acquired: Secondary | ICD-10-CM | POA: Diagnosis not present

## 2021-01-21 DIAGNOSIS — G319 Degenerative disease of nervous system, unspecified: Secondary | ICD-10-CM | POA: Diagnosis not present

## 2021-01-21 DIAGNOSIS — Z743 Need for continuous supervision: Secondary | ICD-10-CM | POA: Diagnosis not present

## 2021-01-21 DIAGNOSIS — I10 Essential (primary) hypertension: Secondary | ICD-10-CM | POA: Diagnosis not present

## 2021-01-21 DIAGNOSIS — F0390 Unspecified dementia without behavioral disturbance: Secondary | ICD-10-CM | POA: Diagnosis not present

## 2021-01-21 DIAGNOSIS — Z7982 Long term (current) use of aspirin: Secondary | ICD-10-CM | POA: Diagnosis not present

## 2021-01-21 DIAGNOSIS — M542 Cervicalgia: Secondary | ICD-10-CM | POA: Diagnosis not present

## 2021-01-21 DIAGNOSIS — R519 Headache, unspecified: Secondary | ICD-10-CM | POA: Diagnosis not present

## 2021-01-21 DIAGNOSIS — G4489 Other headache syndrome: Secondary | ICD-10-CM | POA: Diagnosis not present

## 2021-01-21 DIAGNOSIS — R404 Transient alteration of awareness: Secondary | ICD-10-CM | POA: Diagnosis not present

## 2021-01-22 DIAGNOSIS — I10 Essential (primary) hypertension: Secondary | ICD-10-CM | POA: Diagnosis not present

## 2021-01-22 DIAGNOSIS — F32A Depression, unspecified: Secondary | ICD-10-CM | POA: Diagnosis not present

## 2021-01-22 DIAGNOSIS — Z79899 Other long term (current) drug therapy: Secondary | ICD-10-CM | POA: Diagnosis not present

## 2021-01-22 DIAGNOSIS — E039 Hypothyroidism, unspecified: Secondary | ICD-10-CM | POA: Diagnosis not present

## 2021-01-22 DIAGNOSIS — E119 Type 2 diabetes mellitus without complications: Secondary | ICD-10-CM | POA: Diagnosis not present

## 2021-01-22 DIAGNOSIS — S61101D Unspecified open wound of right thumb with damage to nail, subsequent encounter: Secondary | ICD-10-CM | POA: Diagnosis not present

## 2021-01-22 DIAGNOSIS — E876 Hypokalemia: Secondary | ICD-10-CM | POA: Diagnosis not present

## 2021-01-22 DIAGNOSIS — J690 Pneumonitis due to inhalation of food and vomit: Secondary | ICD-10-CM | POA: Diagnosis not present

## 2021-01-22 DIAGNOSIS — D518 Other vitamin B12 deficiency anemias: Secondary | ICD-10-CM | POA: Diagnosis not present

## 2021-01-22 DIAGNOSIS — E7849 Other hyperlipidemia: Secondary | ICD-10-CM | POA: Diagnosis not present

## 2021-01-22 DIAGNOSIS — A419 Sepsis, unspecified organism: Secondary | ICD-10-CM | POA: Diagnosis not present

## 2021-01-22 DIAGNOSIS — K219 Gastro-esophageal reflux disease without esophagitis: Secondary | ICD-10-CM | POA: Diagnosis not present

## 2021-01-22 DIAGNOSIS — F0280 Dementia in other diseases classified elsewhere without behavioral disturbance: Secondary | ICD-10-CM | POA: Diagnosis not present

## 2021-01-23 DIAGNOSIS — I6782 Cerebral ischemia: Secondary | ICD-10-CM | POA: Diagnosis not present

## 2021-01-23 DIAGNOSIS — E039 Hypothyroidism, unspecified: Secondary | ICD-10-CM | POA: Diagnosis not present

## 2021-01-23 DIAGNOSIS — E876 Hypokalemia: Secondary | ICD-10-CM | POA: Diagnosis not present

## 2021-01-23 DIAGNOSIS — B9689 Other specified bacterial agents as the cause of diseases classified elsewhere: Secondary | ICD-10-CM | POA: Diagnosis not present

## 2021-01-23 DIAGNOSIS — I499 Cardiac arrhythmia, unspecified: Secondary | ICD-10-CM | POA: Diagnosis not present

## 2021-01-23 DIAGNOSIS — I10 Essential (primary) hypertension: Secondary | ICD-10-CM | POA: Diagnosis not present

## 2021-01-23 DIAGNOSIS — M47812 Spondylosis without myelopathy or radiculopathy, cervical region: Secondary | ICD-10-CM | POA: Diagnosis not present

## 2021-01-23 DIAGNOSIS — J690 Pneumonitis due to inhalation of food and vomit: Secondary | ICD-10-CM | POA: Diagnosis not present

## 2021-01-23 DIAGNOSIS — F0390 Unspecified dementia without behavioral disturbance: Secondary | ICD-10-CM | POA: Diagnosis not present

## 2021-01-23 DIAGNOSIS — I440 Atrioventricular block, first degree: Secondary | ICD-10-CM | POA: Diagnosis not present

## 2021-01-23 DIAGNOSIS — R55 Syncope and collapse: Secondary | ICD-10-CM | POA: Diagnosis not present

## 2021-01-23 DIAGNOSIS — R404 Transient alteration of awareness: Secondary | ICD-10-CM | POA: Diagnosis not present

## 2021-01-23 DIAGNOSIS — K219 Gastro-esophageal reflux disease without esophagitis: Secondary | ICD-10-CM | POA: Diagnosis not present

## 2021-01-23 DIAGNOSIS — S61101D Unspecified open wound of right thumb with damage to nail, subsequent encounter: Secondary | ICD-10-CM | POA: Diagnosis not present

## 2021-01-23 DIAGNOSIS — F0280 Dementia in other diseases classified elsewhere without behavioral disturbance: Secondary | ICD-10-CM | POA: Diagnosis not present

## 2021-01-23 DIAGNOSIS — A419 Sepsis, unspecified organism: Secondary | ICD-10-CM | POA: Diagnosis not present

## 2021-01-23 DIAGNOSIS — N390 Urinary tract infection, site not specified: Secondary | ICD-10-CM | POA: Diagnosis not present

## 2021-01-23 DIAGNOSIS — F32A Depression, unspecified: Secondary | ICD-10-CM | POA: Diagnosis not present

## 2021-01-23 DIAGNOSIS — I2510 Atherosclerotic heart disease of native coronary artery without angina pectoris: Secondary | ICD-10-CM | POA: Diagnosis not present

## 2021-01-25 DIAGNOSIS — I10 Essential (primary) hypertension: Secondary | ICD-10-CM | POA: Diagnosis not present

## 2021-01-25 DIAGNOSIS — F0281 Dementia in other diseases classified elsewhere with behavioral disturbance: Secondary | ICD-10-CM | POA: Diagnosis not present

## 2021-01-25 DIAGNOSIS — E119 Type 2 diabetes mellitus without complications: Secondary | ICD-10-CM | POA: Diagnosis not present

## 2021-01-25 DIAGNOSIS — D518 Other vitamin B12 deficiency anemias: Secondary | ICD-10-CM | POA: Diagnosis not present

## 2021-01-25 DIAGNOSIS — F331 Major depressive disorder, recurrent, moderate: Secondary | ICD-10-CM | POA: Diagnosis not present

## 2021-01-25 DIAGNOSIS — K219 Gastro-esophageal reflux disease without esophagitis: Secondary | ICD-10-CM | POA: Diagnosis not present

## 2021-01-25 DIAGNOSIS — E559 Vitamin D deficiency, unspecified: Secondary | ICD-10-CM | POA: Diagnosis not present

## 2021-01-25 DIAGNOSIS — E876 Hypokalemia: Secondary | ICD-10-CM | POA: Diagnosis not present

## 2021-01-25 DIAGNOSIS — F32A Depression, unspecified: Secondary | ICD-10-CM | POA: Diagnosis not present

## 2021-01-25 DIAGNOSIS — A419 Sepsis, unspecified organism: Secondary | ICD-10-CM | POA: Diagnosis not present

## 2021-01-25 DIAGNOSIS — I2510 Atherosclerotic heart disease of native coronary artery without angina pectoris: Secondary | ICD-10-CM | POA: Diagnosis not present

## 2021-01-25 DIAGNOSIS — E039 Hypothyroidism, unspecified: Secondary | ICD-10-CM | POA: Diagnosis not present

## 2021-01-25 DIAGNOSIS — J690 Pneumonitis due to inhalation of food and vomit: Secondary | ICD-10-CM | POA: Diagnosis not present

## 2021-01-25 DIAGNOSIS — S61101D Unspecified open wound of right thumb with damage to nail, subsequent encounter: Secondary | ICD-10-CM | POA: Diagnosis not present

## 2021-01-25 DIAGNOSIS — F0280 Dementia in other diseases classified elsewhere without behavioral disturbance: Secondary | ICD-10-CM | POA: Diagnosis not present

## 2021-01-27 DIAGNOSIS — I440 Atrioventricular block, first degree: Secondary | ICD-10-CM | POA: Diagnosis not present

## 2021-01-27 DIAGNOSIS — R1110 Vomiting, unspecified: Secondary | ICD-10-CM | POA: Diagnosis not present

## 2021-01-27 DIAGNOSIS — I10 Essential (primary) hypertension: Secondary | ICD-10-CM | POA: Diagnosis not present

## 2021-01-27 DIAGNOSIS — R231 Pallor: Secondary | ICD-10-CM | POA: Diagnosis not present

## 2021-01-27 DIAGNOSIS — R262 Difficulty in walking, not elsewhere classified: Secondary | ICD-10-CM | POA: Diagnosis not present

## 2021-01-27 DIAGNOSIS — N170 Acute kidney failure with tubular necrosis: Secondary | ICD-10-CM | POA: Diagnosis not present

## 2021-01-27 DIAGNOSIS — R112 Nausea with vomiting, unspecified: Secondary | ICD-10-CM | POA: Diagnosis not present

## 2021-01-27 DIAGNOSIS — E860 Dehydration: Secondary | ICD-10-CM | POA: Diagnosis not present

## 2021-01-27 DIAGNOSIS — D696 Thrombocytopenia, unspecified: Secondary | ICD-10-CM | POA: Diagnosis not present

## 2021-01-27 DIAGNOSIS — R55 Syncope and collapse: Secondary | ICD-10-CM | POA: Diagnosis not present

## 2021-01-27 DIAGNOSIS — R0902 Hypoxemia: Secondary | ICD-10-CM | POA: Diagnosis not present

## 2021-01-27 DIAGNOSIS — R410 Disorientation, unspecified: Secondary | ICD-10-CM | POA: Diagnosis not present

## 2021-01-27 DIAGNOSIS — R4020 Unspecified coma: Secondary | ICD-10-CM | POA: Diagnosis not present

## 2021-01-27 DIAGNOSIS — R404 Transient alteration of awareness: Secondary | ICD-10-CM | POA: Diagnosis not present

## 2021-01-27 DIAGNOSIS — R4182 Altered mental status, unspecified: Secondary | ICD-10-CM | POA: Diagnosis not present

## 2021-01-27 DIAGNOSIS — R110 Nausea: Secondary | ICD-10-CM | POA: Diagnosis not present

## 2021-01-27 DIAGNOSIS — I361 Nonrheumatic tricuspid (valve) insufficiency: Secondary | ICD-10-CM | POA: Diagnosis not present

## 2021-01-27 DIAGNOSIS — G9341 Metabolic encephalopathy: Secondary | ICD-10-CM | POA: Diagnosis not present

## 2021-01-27 DIAGNOSIS — R296 Repeated falls: Secondary | ICD-10-CM | POA: Diagnosis not present

## 2021-01-28 DIAGNOSIS — I440 Atrioventricular block, first degree: Secondary | ICD-10-CM | POA: Diagnosis not present

## 2021-01-28 DIAGNOSIS — I361 Nonrheumatic tricuspid (valve) insufficiency: Secondary | ICD-10-CM

## 2021-01-29 ENCOUNTER — Other Ambulatory Visit: Payer: Self-pay

## 2021-01-29 DIAGNOSIS — F0281 Dementia in other diseases classified elsewhere with behavioral disturbance: Secondary | ICD-10-CM | POA: Diagnosis not present

## 2021-01-29 DIAGNOSIS — E039 Hypothyroidism, unspecified: Secondary | ICD-10-CM | POA: Diagnosis not present

## 2021-01-29 DIAGNOSIS — F0280 Dementia in other diseases classified elsewhere without behavioral disturbance: Secondary | ICD-10-CM | POA: Diagnosis not present

## 2021-01-29 DIAGNOSIS — F39 Unspecified mood [affective] disorder: Secondary | ICD-10-CM | POA: Diagnosis not present

## 2021-01-29 DIAGNOSIS — N390 Urinary tract infection, site not specified: Secondary | ICD-10-CM | POA: Diagnosis not present

## 2021-01-29 DIAGNOSIS — J690 Pneumonitis due to inhalation of food and vomit: Secondary | ICD-10-CM | POA: Diagnosis not present

## 2021-01-29 DIAGNOSIS — B372 Candidiasis of skin and nail: Secondary | ICD-10-CM | POA: Diagnosis not present

## 2021-01-29 DIAGNOSIS — S61101D Unspecified open wound of right thumb with damage to nail, subsequent encounter: Secondary | ICD-10-CM | POA: Diagnosis not present

## 2021-01-29 DIAGNOSIS — F32A Depression, unspecified: Secondary | ICD-10-CM | POA: Diagnosis not present

## 2021-01-29 DIAGNOSIS — A419 Sepsis, unspecified organism: Secondary | ICD-10-CM | POA: Diagnosis not present

## 2021-01-29 DIAGNOSIS — K219 Gastro-esophageal reflux disease without esophagitis: Secondary | ICD-10-CM | POA: Diagnosis not present

## 2021-01-29 DIAGNOSIS — E876 Hypokalemia: Secondary | ICD-10-CM | POA: Diagnosis not present

## 2021-01-29 DIAGNOSIS — I10 Essential (primary) hypertension: Secondary | ICD-10-CM | POA: Diagnosis not present

## 2021-01-29 NOTE — Patient Outreach (Signed)
[facility]) Care Management  01/29/2021  RHEALYNN MYHRE 1933/06/10 [id number]     Transition of Care Referral  Referral Date: 01/29/2021 Referral Source: [facility] Discharge Report Date of Discharge: 01/28/2021 Facility:[facility]     Referral received. Upon chart review noted that patient discharged to SNF/ALF facility.     Plan: RN CM will close referral at this time.   Enzo Montgomery, RN,BSN,CCM [facility] Management Telephonic Care Management Coordinator Direct Phone: [phone] Toll Free: [phone] Fax: [phone]

## 2021-01-30 DIAGNOSIS — S52501A Unspecified fracture of the lower end of right radius, initial encounter for closed fracture: Secondary | ICD-10-CM | POA: Diagnosis not present

## 2021-01-31 DIAGNOSIS — K219 Gastro-esophageal reflux disease without esophagitis: Secondary | ICD-10-CM | POA: Diagnosis not present

## 2021-01-31 DIAGNOSIS — S61101D Unspecified open wound of right thumb with damage to nail, subsequent encounter: Secondary | ICD-10-CM | POA: Diagnosis not present

## 2021-01-31 DIAGNOSIS — A419 Sepsis, unspecified organism: Secondary | ICD-10-CM | POA: Diagnosis not present

## 2021-01-31 DIAGNOSIS — F0280 Dementia in other diseases classified elsewhere without behavioral disturbance: Secondary | ICD-10-CM | POA: Diagnosis not present

## 2021-01-31 DIAGNOSIS — E876 Hypokalemia: Secondary | ICD-10-CM | POA: Diagnosis not present

## 2021-01-31 DIAGNOSIS — F32A Depression, unspecified: Secondary | ICD-10-CM | POA: Diagnosis not present

## 2021-01-31 DIAGNOSIS — I10 Essential (primary) hypertension: Secondary | ICD-10-CM | POA: Diagnosis not present

## 2021-01-31 DIAGNOSIS — E039 Hypothyroidism, unspecified: Secondary | ICD-10-CM | POA: Diagnosis not present

## 2021-01-31 DIAGNOSIS — J690 Pneumonitis due to inhalation of food and vomit: Secondary | ICD-10-CM | POA: Diagnosis not present

## 2021-02-01 DIAGNOSIS — F0280 Dementia in other diseases classified elsewhere without behavioral disturbance: Secondary | ICD-10-CM | POA: Diagnosis not present

## 2021-02-01 DIAGNOSIS — F32A Depression, unspecified: Secondary | ICD-10-CM | POA: Diagnosis not present

## 2021-02-01 DIAGNOSIS — A419 Sepsis, unspecified organism: Secondary | ICD-10-CM | POA: Diagnosis not present

## 2021-02-01 DIAGNOSIS — K219 Gastro-esophageal reflux disease without esophagitis: Secondary | ICD-10-CM | POA: Diagnosis not present

## 2021-02-01 DIAGNOSIS — I10 Essential (primary) hypertension: Secondary | ICD-10-CM | POA: Diagnosis not present

## 2021-02-01 DIAGNOSIS — J690 Pneumonitis due to inhalation of food and vomit: Secondary | ICD-10-CM | POA: Diagnosis not present

## 2021-02-01 DIAGNOSIS — E039 Hypothyroidism, unspecified: Secondary | ICD-10-CM | POA: Diagnosis not present

## 2021-02-01 DIAGNOSIS — E876 Hypokalemia: Secondary | ICD-10-CM | POA: Diagnosis not present

## 2021-02-01 DIAGNOSIS — S61101D Unspecified open wound of right thumb with damage to nail, subsequent encounter: Secondary | ICD-10-CM | POA: Diagnosis not present

## 2021-02-02 DIAGNOSIS — W19XXXA Unspecified fall, initial encounter: Secondary | ICD-10-CM | POA: Diagnosis not present

## 2021-02-02 DIAGNOSIS — R5381 Other malaise: Secondary | ICD-10-CM | POA: Diagnosis not present

## 2021-02-02 DIAGNOSIS — J849 Interstitial pulmonary disease, unspecified: Secondary | ICD-10-CM | POA: Diagnosis not present

## 2021-02-02 DIAGNOSIS — B9689 Other specified bacterial agents as the cause of diseases classified elsewhere: Secondary | ICD-10-CM | POA: Diagnosis not present

## 2021-02-02 DIAGNOSIS — N3001 Acute cystitis with hematuria: Secondary | ICD-10-CM | POA: Diagnosis not present

## 2021-02-02 DIAGNOSIS — G4489 Other headache syndrome: Secondary | ICD-10-CM | POA: Diagnosis not present

## 2021-02-02 DIAGNOSIS — R918 Other nonspecific abnormal finding of lung field: Secondary | ICD-10-CM | POA: Diagnosis not present

## 2021-02-02 DIAGNOSIS — S0003XA Contusion of scalp, initial encounter: Secondary | ICD-10-CM | POA: Diagnosis not present

## 2021-02-02 DIAGNOSIS — S199XXA Unspecified injury of neck, initial encounter: Secondary | ICD-10-CM | POA: Diagnosis not present

## 2021-02-02 DIAGNOSIS — S0990XA Unspecified injury of head, initial encounter: Secondary | ICD-10-CM | POA: Diagnosis not present

## 2021-02-02 DIAGNOSIS — R2689 Other abnormalities of gait and mobility: Secondary | ICD-10-CM | POA: Diagnosis not present

## 2021-02-05 DIAGNOSIS — D518 Other vitamin B12 deficiency anemias: Secondary | ICD-10-CM | POA: Diagnosis not present

## 2021-02-05 DIAGNOSIS — F0281 Dementia in other diseases classified elsewhere with behavioral disturbance: Secondary | ICD-10-CM | POA: Diagnosis not present

## 2021-02-05 DIAGNOSIS — I10 Essential (primary) hypertension: Secondary | ICD-10-CM | POA: Diagnosis not present

## 2021-02-05 DIAGNOSIS — N390 Urinary tract infection, site not specified: Secondary | ICD-10-CM | POA: Diagnosis not present

## 2021-02-05 DIAGNOSIS — E7849 Other hyperlipidemia: Secondary | ICD-10-CM | POA: Diagnosis not present

## 2021-02-05 DIAGNOSIS — E119 Type 2 diabetes mellitus without complications: Secondary | ICD-10-CM | POA: Diagnosis not present

## 2021-02-05 DIAGNOSIS — G3183 Dementia with Lewy bodies: Secondary | ICD-10-CM | POA: Diagnosis not present

## 2021-02-05 DIAGNOSIS — Z79899 Other long term (current) drug therapy: Secondary | ICD-10-CM | POA: Diagnosis not present

## 2021-07-11 DEATH — deceased
